# Patient Record
Sex: Male | Born: 1960
Health system: Southern US, Community
[De-identification: ages and names within clinical notes are randomized; demographics above are authoritative.]

## PROBLEM LIST (undated history)

## (undated) DIAGNOSIS — H269 Unspecified cataract: Secondary | ICD-10-CM

## (undated) DIAGNOSIS — T7840XA Allergy, unspecified, initial encounter: Secondary | ICD-10-CM

## (undated) HISTORY — DX: Allergy, unspecified, initial encounter: T78.40XA

## (undated) HISTORY — DX: Unspecified cataract: H26.9

## (undated) HISTORY — PX: EYE SURGERY: SHX253

---

## 2010-05-26 ENCOUNTER — Encounter: Admission: RE | Admit: 2010-05-26 | Discharge: 2010-05-26 | Payer: Self-pay | Admitting: Emergency Medicine

## 2012-04-09 ENCOUNTER — Encounter: Payer: Self-pay | Admitting: Emergency Medicine

## 2012-04-09 ENCOUNTER — Ambulatory Visit (INDEPENDENT_AMBULATORY_CARE_PROVIDER_SITE_OTHER): Payer: BC Managed Care – PPO | Admitting: Emergency Medicine

## 2012-04-09 ENCOUNTER — Ambulatory Visit: Payer: BC Managed Care – PPO

## 2012-04-09 VITALS — BP 102/60 | HR 80 | Temp 98.0°F | Resp 16 | Ht 71.0 in | Wt 161.0 lb

## 2012-04-09 DIAGNOSIS — M1990 Unspecified osteoarthritis, unspecified site: Secondary | ICD-10-CM

## 2012-04-09 DIAGNOSIS — M129 Arthropathy, unspecified: Secondary | ICD-10-CM

## 2012-04-09 LAB — POCT CBC
HCT, POC: 51.4 % (ref 43.5–53.7)
Hemoglobin: 16.5 g/dL (ref 14.1–18.1)
Lymph, poc: 1.9 (ref 0.6–3.4)
MCH, POC: 31.3 pg — AB (ref 27–31.2)
MCHC: 32.1 g/dL (ref 31.8–35.4)
MCV: 97.6 fL — AB (ref 80–97)
WBC: 6.8 10*3/uL (ref 4.6–10.2)

## 2012-04-09 LAB — POCT SEDIMENTATION RATE: POCT SED RATE: 3 mm/hr (ref 0–22)

## 2012-04-09 MED ORDER — MELOXICAM 7.5 MG PO TABS: ORAL_TABLET | ORAL | Status: DC

## 2012-04-09 NOTE — Progress Notes (Signed)
  Subjective:    Patient ID: Stephen Peters, male    DOB: April 23, 1961, 51 y.o.   MRN: [id number]  HPI patient here complaining of pain in multiple joints these included shoulders shoulder blades elbows hands wrists knees and feet. He states his joints do not become red or swollen. He has tried some Tylenol and ibuprofen but none of these seem to help. He denies any family history of arthritis.    Review of Systems     Objective:   Physical Exam physical exam does not reveal any signs of arthritic changes of his hands feet knees or scapula.  [facility] reading (PRIMARY) by  Dr.Naomia Lenderman multiple views of the hands wrists knee do not reveal any signs of arthritis or any significant abnormality.       Assessment & Plan:    He's not showing signs of inflammatory arthritis on x-ray. Labs were done. We'll try Mobic 7.5 and see if that gives him some help .

## 2012-04-10 LAB — COMPREHENSIVE METABOLIC PANEL
ALT: 15 U/L (ref 0–53)
AST: 21 U/L (ref 0–37)
Albumin: 4.7 g/dL (ref 3.5–5.2)
Alkaline Phosphatase: 56 U/L (ref 39–117)
BUN: 23 mg/dL (ref 6–23)
CO2: 28 mEq/L (ref 19–32)
Calcium: 9.4 mg/dL (ref 8.4–10.5)
Chloride: 102 mEq/L (ref 96–112)
Creat: 0.9 mg/dL (ref 0.50–1.35)
Glucose, Bld: 112 mg/dL — ABNORMAL HIGH (ref 70–99)
Potassium: 4.4 mEq/L (ref 3.5–5.3)
Sodium: 138 mEq/L (ref 135–145)
Total Bilirubin: 0.6 mg/dL (ref 0.3–1.2)
Total Protein: 7 g/dL (ref 6.0–8.3)

## 2012-04-10 LAB — HLA-B27 ANTIGEN: DNA Result:: NOT DETECTED

## 2012-04-10 LAB — URIC ACID: Uric Acid, Serum: 4.3 mg/dL (ref 4.0–7.8)

## 2012-04-10 LAB — RHEUMATOID FACTOR: Rhuematoid fact SerPl-aCnc: 11 IU/mL (ref <=14)

## 2012-04-10 LAB — ANA: Anti Nuclear Antibody(ANA): NEGATIVE

## 2012-08-29 ENCOUNTER — Ambulatory Visit (INDEPENDENT_AMBULATORY_CARE_PROVIDER_SITE_OTHER): Payer: BC Managed Care – PPO | Admitting: Internal Medicine

## 2012-08-29 VITALS — BP 137/83 | HR 100 | Temp 98.4°F | Resp 17 | Ht 70.5 in | Wt 157.0 lb

## 2012-08-29 DIAGNOSIS — T7840XA Allergy, unspecified, initial encounter: Secondary | ICD-10-CM

## 2012-08-29 DIAGNOSIS — R05 Cough: Secondary | ICD-10-CM

## 2012-08-29 DIAGNOSIS — J329 Chronic sinusitis, unspecified: Secondary | ICD-10-CM

## 2012-08-29 DIAGNOSIS — R059 Cough, unspecified: Secondary | ICD-10-CM

## 2012-08-29 MED ORDER — FLUTICASONE PROPIONATE 50 MCG/ACT NA SUSP: 2.0000 | Freq: Every day | NASAL | Status: DC

## 2012-08-29 MED ORDER — AZITHROMYCIN 500 MG PO TABS: 500.0000 mg | ORAL_TABLET | Freq: Every day | ORAL | Status: DC

## 2012-08-29 MED ORDER — HYDROCODONE-ACETAMINOPHEN 7.5-325 MG/15ML PO SOLN: 5.0000 mL | Freq: Four times a day (QID) | ORAL | Status: DC | PRN

## 2012-08-29 NOTE — Patient Instructions (Signed)
Allergic Rhinitis Allergic rhinitis is when the mucous membranes in the nose respond to allergens. Allergens are particles in the air that cause your body to have an allergic reaction. This causes you to release allergic antibodies. Through a chain of events, these eventually cause you to release histamine into the blood stream (hence the use of antihistamines). Although meant to be protective to the body, it is this release that causes your discomfort, such as frequent sneezing, congestion and an itchy runny nose.  CAUSES  The pollen allergens may come from grasses, trees, and weeds. This is seasonal allergic rhinitis, or "hay fever." Other allergens cause year-round allergic rhinitis (perennial allergic rhinitis) such as house dust mite allergen, pet dander and mold spores.  SYMPTOMS   Nasal stuffiness (congestion).  Runny, itchy nose with sneezing and tearing of the eyes.  There is often an itching of the mouth, eyes and ears. It cannot be cured, but it can be controlled with medications. DIAGNOSIS  If you are unable to determine the offending allergen, skin or blood testing may find it. TREATMENT   Avoid the allergen.  Medications and allergy shots (immunotherapy) can help.  Hay fever may often be treated with antihistamines in pill or nasal spray forms. Antihistamines block the effects of histamine. There are over-the-counter medicines that may help with nasal congestion and swelling around the eyes. Check with your caregiver before taking or giving this medicine. If the treatment above does not work, there are many new medications your caregiver can prescribe. Stronger medications may be used if initial measures are ineffective. Desensitizing injections can be used if medications and avoidance fails. Desensitization is when a patient is given ongoing shots until the body becomes less sensitive to the allergen. Make sure you follow up with your caregiver if problems continue. SEEK MEDICAL  CARE IF:   You develop fever (more than 100.5 F (38.1 C).  You develop a cough that does not stop easily (persistent).  You have shortness of breath.  You start wheezing.  Symptoms interfere with normal daily activities. Document Released: 04/24/2001 Document Revised: 10/22/2011 Document Reviewed: 11/03/2008 ExitCare Patient Information 2013 [employer]. Sinusitis Sinusitis is redness, soreness, and swelling (inflammation) of the paranasal sinuses. Paranasal sinuses are air pockets within the bones of your face (beneath the eyes, the middle of the forehead, or above the eyes). In healthy paranasal sinuses, mucus is able to drain out, and air is able to circulate through them by way of your nose. However, when your paranasal sinuses are inflamed, mucus and air can become trapped. This can allow bacteria and other germs to grow and cause infection. Sinusitis can develop quickly and last only a short time (acute) or continue over a long period (chronic). Sinusitis that lasts for more than 12 weeks is considered chronic.  CAUSES  Causes of sinusitis include:  Allergies.  Structural abnormalities, such as displacement of the cartilage that separates your nostrils (deviated septum), which can decrease the air flow through your nose and sinuses and affect sinus drainage.  Functional abnormalities, such as when the small hairs (cilia) that line your sinuses and help remove mucus do not work properly or are not present. SYMPTOMS  Symptoms of acute and chronic sinusitis are the same. The primary symptoms are pain and pressure around the affected sinuses. Other symptoms include:  Upper toothache.  Earache.  Headache.  Bad breath.  Decreased sense of smell and taste.  A cough, which worsens when you are lying flat.  Fatigue.    Fever.  Thick drainage from your nose, which often is green and may contain pus (purulent).  Swelling and warmth over the affected sinuses. DIAGNOSIS    Your caregiver will perform a physical exam. During the exam, your caregiver may:  Look in your nose for signs of abnormal growths in your nostrils (nasal polyps).  Tap over the affected sinus to check for signs of infection.  View the inside of your sinuses (endoscopy) with a special imaging device with a light attached (endoscope), which is inserted into your sinuses. If your caregiver suspects that you have chronic sinusitis, one or more of the following tests may be recommended:  Allergy tests.  Nasal culture A sample of mucus is taken from your nose and sent to a lab and screened for bacteria.  Nasal cytology A sample of mucus is taken from your nose and examined by your caregiver to determine if your sinusitis is related to an allergy. TREATMENT  Most cases of acute sinusitis are related to a viral infection and will resolve on their own within 10 days. Sometimes medicines are prescribed to help relieve symptoms (pain medicine, decongestants, nasal steroid sprays, or saline sprays).  However, for sinusitis related to a bacterial infection, your caregiver will prescribe antibiotic medicines. These are medicines that will help kill the bacteria causing the infection.  Rarely, sinusitis is caused by a fungal infection. In theses cases, your caregiver will prescribe antifungal medicine. For some cases of chronic sinusitis, surgery is needed. Generally, these are cases in which sinusitis recurs more than 3 times per year, despite other treatments. HOME CARE INSTRUCTIONS   Drink plenty of water. Water helps thin the mucus so your sinuses can drain more easily.  Use a humidifier.  Inhale steam 3 to 4 times a day (for example, sit in the bathroom with the shower running).  Apply a warm, moist washcloth to your face 3 to 4 times a day, or as directed by your caregiver.  Use saline nasal sprays to help moisten and clean your sinuses.  Take over-the-counter or prescription medicines for  pain, discomfort, or fever only as directed by your caregiver. SEEK IMMEDIATE MEDICAL CARE IF:  You have increasing pain or severe headaches.  You have nausea, vomiting, or drowsiness.  You have swelling around your face.  You have vision problems.  You have a stiff neck.  You have difficulty breathing. MAKE SURE YOU:   Understand these instructions.  Will watch your condition.  Will get help right away if you are not doing well or get worse. Document Released: 07/30/2005 Document Revised: 10/22/2011 Document Reviewed: 08/14/2011 ExitCare Patient Information 2013 [employer].  

## 2012-08-29 NOTE — Progress Notes (Signed)
  Subjective:    Patient ID: Stephen Peters, male    DOB: Jul 24, 1961, 52 y.o.   MRN: [id number]  HPI Has seasonal allergys every fall and winter. Not on any prevention. Now has green nasal disch and face pain. Also has minimal productive cough.   Review of Systems     Objective:   Physical Exam  Constitutional: He is oriented to person, place, and time. He appears well-developed and well-nourished. No distress.  HENT:  Right Ear: External ear normal.  Left Ear: External ear normal.  Nose: Mucosal edema, rhinorrhea and sinus tenderness present. Right sinus exhibits maxillary sinus tenderness and frontal sinus tenderness. Left sinus exhibits maxillary sinus tenderness and frontal sinus tenderness.  Mouth/Throat: Oropharynx is clear and moist.  Neck: Neck supple.  Cardiovascular: Normal rate, regular rhythm and normal heart sounds.   Pulmonary/Chest: Effort normal and breath sounds normal.  Neurological: He is alert and oriented to person, place, and time. He exhibits normal muscle tone. Coordination normal.  Psychiatric: He has a normal mood and affect.          Assessment & Plan:  Sinusitis/allergic rhinitis/cough

## 2012-09-03 ENCOUNTER — Telehealth: Payer: Self-pay

## 2012-09-03 MED ORDER — OSELTAMIVIR PHOSPHATE 75 MG PO CAPS: 75.0000 mg | ORAL_CAPSULE | Freq: Two times a day (BID) | ORAL | Status: DC

## 2012-09-03 NOTE — Telephone Encounter (Signed)
Patient was seen last week for sinus issues but he thinks it was actually the flu since his son Alanda Slim  DOB 01/05/98 was seen today for positive flu test. His other child was rx'd tamiflu and he is wondering if he can get an rx for tamiflu as well?

## 2012-09-03 NOTE — Telephone Encounter (Signed)
Sent to pharmacy 

## 2012-09-04 NOTE — Telephone Encounter (Signed)
Pt notified that rx was sent in 

## 2012-11-11 DIAGNOSIS — Z0271 Encounter for disability determination: Secondary | ICD-10-CM

## 2013-03-07 ENCOUNTER — Encounter (HOSPITAL_BASED_OUTPATIENT_CLINIC_OR_DEPARTMENT_OTHER): Payer: Self-pay | Admitting: Emergency Medicine

## 2013-03-07 ENCOUNTER — Emergency Department (HOSPITAL_BASED_OUTPATIENT_CLINIC_OR_DEPARTMENT_OTHER)
Admission: EM | Admit: 2013-03-07 | Discharge: 2013-03-07 | Disposition: A | Discharge status: Home / Self Care | Payer: BC Managed Care – PPO | Attending: Emergency Medicine | Admitting: Emergency Medicine

## 2013-03-07 DIAGNOSIS — R21 Rash and other nonspecific skin eruption: Secondary | ICD-10-CM | POA: Insufficient documentation

## 2013-03-07 DIAGNOSIS — T63461A Toxic effect of venom of wasps, accidental (unintentional), initial encounter: Secondary | ICD-10-CM | POA: Insufficient documentation

## 2013-03-07 DIAGNOSIS — F17200 Nicotine dependence, unspecified, uncomplicated: Secondary | ICD-10-CM | POA: Insufficient documentation

## 2013-03-07 DIAGNOSIS — Z8669 Personal history of other diseases of the nervous system and sense organs: Secondary | ICD-10-CM | POA: Insufficient documentation

## 2013-03-07 DIAGNOSIS — Y92009 Unspecified place in unspecified non-institutional (private) residence as the place of occurrence of the external cause: Secondary | ICD-10-CM | POA: Insufficient documentation

## 2013-03-07 DIAGNOSIS — Y9389 Activity, other specified: Secondary | ICD-10-CM | POA: Insufficient documentation

## 2013-03-07 DIAGNOSIS — T6391XA Toxic effect of contact with unspecified venomous animal, accidental (unintentional), initial encounter: Secondary | ICD-10-CM | POA: Insufficient documentation

## 2013-03-07 LAB — CBC WITH DIFFERENTIAL/PLATELET
Basophils Absolute: 0 10*3/uL (ref 0.0–0.1)
Basophils Relative: 0 % (ref 0–1)
Eosinophils Relative: 3 % (ref 0–5)
HCT: 41.1 % (ref 39.0–52.0)
MCHC: 35.5 g/dL (ref 30.0–36.0)
MCV: 90.5 fL (ref 78.0–100.0)
Monocytes Absolute: 0.4 10*3/uL (ref 0.1–1.0)
Neutro Abs: 7 10*3/uL (ref 1.7–7.7)
RDW: 12.5 % (ref 11.5–15.5)

## 2013-03-07 LAB — BASIC METABOLIC PANEL
Calcium: 9.2 mg/dL (ref 8.4–10.5)
Creatinine, Ser: 0.9 mg/dL (ref 0.50–1.35)
GFR calc Af Amer: >90 mL/min (ref >90)
GFR calc non Af Amer: >90 mL/min (ref >90)

## 2013-03-07 LAB — CK: Total CK: 289 U/L — ABNORMAL HIGH (ref 7–232)

## 2013-03-07 MED ORDER — METHYLPREDNISOLONE SODIUM SUCC 125 MG IJ SOLR
125.0000 mg | Freq: Once | INTRAMUSCULAR | Status: AC
  Administered 2013-03-07: 125 mg via INTRAVENOUS
  Filled 2013-03-07: qty 2

## 2013-03-07 MED ORDER — OXYCODONE-ACETAMINOPHEN 5-325 MG PO TABS: 2.0000 | ORAL_TABLET | ORAL | Status: DC | PRN

## 2013-03-07 MED ORDER — HYDROMORPHONE HCL PF 1 MG/ML IJ SOLN
1.0000 mg | Freq: Once | INTRAMUSCULAR | Status: AC
  Administered 2013-03-07: 1 mg via INTRAVENOUS
  Filled 2013-03-07: qty 1

## 2013-03-07 MED ORDER — SODIUM CHLORIDE 0.9 % IV BOLUS (SEPSIS)
1000.0000 mL | Freq: Once | INTRAVENOUS | Status: AC
  Administered 2013-03-07: 1000 mL via INTRAVENOUS

## 2013-03-07 MED ORDER — PREDNISONE 20 MG PO TABS: ORAL_TABLET | ORAL | Status: DC

## 2013-03-07 MED ORDER — KETOROLAC TROMETHAMINE 30 MG/ML IJ SOLN
30.0000 mg | Freq: Once | INTRAMUSCULAR | Status: AC
  Administered 2013-03-07: 30 mg via INTRAVENOUS
  Filled 2013-03-07: qty 1

## 2013-03-07 MED ORDER — FENTANYL CITRATE 0.05 MG/ML IJ SOLN
50.0000 ug | Freq: Once | INTRAMUSCULAR | Status: AC
  Administered 2013-03-07: 50 ug via INTRAVENOUS
  Filled 2013-03-07: qty 2

## 2013-03-07 NOTE — ED Notes (Signed)
Pt has been stung multiple times by yellow jackets this am.  No airway impairment.  Noted numerous sting areas.

## 2013-03-07 NOTE — ED Provider Notes (Signed)
  CSN: [id number]     Arrival date & time 03/07/13  1222 History     First MD Initiated Contact with Patient 03/07/13 1246     Chief Complaint  Patient presents with  . Insect Bite   (Consider location/radiation/quality/duration/timing/severity/associated sxs/prior Treatment) HPI Pt reports he was working in his yard earlier this morning and was stung numerous times (>20) by yellow jackets. Mostly on lower legs but also on arms and abdomen. His wife gave him 50mg  PO benadryl about 10:30am and 800mg  Motrin around 11:30am. He reports gradually worsening, now moderate to severe aching pain mostly in legs. Denies any throat swelling, difficulty breathing or loss of consciousness but reports he felt 'woozy' in the waiting room. No low BP.  Past Medical History  Diagnosis Date  . Allergy   . Cataract    Past Surgical History  Procedure Laterality Date  . Eye surgery     History reviewed. No pertinent family history. History  Substance Use Topics  . Smoking status: Current Every Day Smoker -- 1.00 packs/day for 30 years    Types: Cigarettes  . Smokeless tobacco: Not on file  . Alcohol Use: No    Review of Systems All other systems reviewed and are negative except as noted in HPI.    Allergies  Review of patient's allergies indicates no active allergies.  Home Medications   Current Outpatient Rx  Name  Route  Sig  Dispense  Refill  . diphenhydrAMINE (BENADRYL) 25 mg capsule   Oral   Take 25 mg by mouth every 6 (six) hours as needed for itching.         Marland Kitchen ibuprofen (ADVIL,MOTRIN) 200 MG tablet   Oral   Take 800 mg by mouth every 6 (six) hours as needed for pain.          BP 141/84  Pulse 94  Temp(Src) 97.7 F (36.5 C) (Oral)  Resp 22  Ht 5' 11.5" (1.816 m)  Wt 160 lb (72.576 kg)  BMI 22.01 kg/m2  SpO2 100% Physical Exam  Nursing note and vitals reviewed. Constitutional: He is oriented to person, place, and time. He appears well-developed and well-nourished.   HENT:  Head: Normocephalic and atraumatic.  Eyes: EOM are normal. Pupils are equal, round, and reactive to light.  Neck: Normal range of motion. Neck supple.  Cardiovascular: Normal rate, normal heart sounds and intact distal pulses.   Pulmonary/Chest: Effort normal and breath sounds normal.  Abdominal: Bowel sounds are normal. He exhibits no distension. There is no tenderness.  Musculoskeletal: Normal range of motion. He exhibits tenderness (tender to palpation of soft tissues, compartments soft). He exhibits no edema.  Neurological: He is alert and oriented to person, place, and time. He has normal strength. No cranial nerve deficit or sensory deficit.  Skin: Skin is warm and dry. Rash (multiple small erythematous areas corresponding to stings, no significant local reaction otherwise) noted.  Psychiatric: He has a normal mood and affect.    ED Course   Procedures (including critical care time)  Labs Reviewed - No data to display No results found. No diagnosis found.  MDM  No improvement in pain despite fentanyl and toradol. Will give dilaudid and check basic labs to rule out more serious reaction/rhabdo. Care signed out to Dr. Fredderick Phenix at [facility].   Nyiah Pianka B. Bernette Mayers, MD 03/07/13 1517

## 2013-03-07 NOTE — ED Notes (Signed)
Patient took two 25mg  benadryl around 10:30 and 800 mg ibuprofen around 11:30

## 2013-03-07 NOTE — ED Provider Notes (Signed)
Pt feeling much better.  Discharged with percocet, prednisone.  Will return if symptoms worsen.  Results for orders placed during the hospital encounter of 03/07/13  CBC WITH DIFFERENTIAL      Result Value Range   WBC 9.1  4.0 - 10.5 K/uL   RBC 4.54  4.22 - 5.81 MIL/uL   Hemoglobin 14.6  13.0 - 17.0 g/dL   HCT 40.9  81.1 - 91.4 %   MCV 90.5  78.0 - 100.0 fL   MCH 32.2  26.0 - 34.0 pg   MCHC 35.5  30.0 - 36.0 g/dL   RDW 78.2  95.6 - 21.3 %   Platelets 187  150 - 400 K/uL   Neutrophils Relative % 77  43 - 77 %   Neutro Abs 7.0  1.7 - 7.7 K/uL   Lymphocytes Relative 16  12 - 46 %   Lymphs Abs 1.4  0.7 - 4.0 K/uL   Monocytes Relative 5  3 - 12 %   Monocytes Absolute 0.4  0.1 - 1.0 K/uL   Eosinophils Relative 3  0 - 5 %   Eosinophils Absolute 0.3  0.0 - 0.7 K/uL   Basophils Relative 0  0 - 1 %   Basophils Absolute 0.0  0.0 - 0.1 K/uL  BASIC METABOLIC PANEL      Result Value Range   Sodium 137  135 - 145 mEq/L   Potassium 4.1  3.5 - 5.1 mEq/L   Chloride 103  96 - 112 mEq/L   CO2 26  19 - 32 mEq/L   Glucose, Bld 109 (*) 70 - 99 mg/dL   BUN 15  6 - 23 mg/dL   Creatinine, Ser 0.86  0.50 - 1.35 mg/dL   Calcium 9.2  8.4 - 57.8 mg/dL   GFR calc non Af Amer >90  >90 mL/min   GFR calc Af Amer >90  >90 mL/min  CK      Result Value Range   Total CK 289 (*) 7 - 232 U/L   No results found.    Rolan Bucco, MD 03/07/13 [phone]

## 2013-04-20 ENCOUNTER — Ambulatory Visit (INDEPENDENT_AMBULATORY_CARE_PROVIDER_SITE_OTHER): Payer: BC Managed Care – PPO | Admitting: Emergency Medicine

## 2013-04-20 ENCOUNTER — Telehealth: Payer: Self-pay

## 2013-04-20 VITALS — BP 120/62 | HR 67 | Temp 98.0°F | Resp 16 | Ht 71.5 in | Wt 170.0 lb

## 2013-04-20 DIAGNOSIS — K219 Gastro-esophageal reflux disease without esophagitis: Secondary | ICD-10-CM

## 2013-04-20 MED ORDER — DEXLANSOPRAZOLE 30 MG PO CPDR: 30.0000 mg | DELAYED_RELEASE_CAPSULE | Freq: Every day | ORAL | Status: DC

## 2013-04-20 NOTE — Telephone Encounter (Signed)
PT STATES THE PHARMACY HAVE BEEN TRYING TO GET IN TOUCH [place] REGARDING A PRIOR AUTHORIZATION FOR HER HUSBAND'S MEDICINE. PLEASE CALL [phone] WHEN DONE THEY REALLY NEED HIS MEDS

## 2013-04-20 NOTE — Progress Notes (Signed)
  Subjective:    Patient ID: Stephen Peters, male    DOB: 05/12/1961, 52 y.o.   MRN: [id number]  HPI Pt complains of acid reflux. He says it's been bothering him over the last couple years, intermittently. Now it is every day. Denies chest pain or SOB. He has been taking prilosec, omeprazole, tums. Last CPE a couple years ago. He states he has gotten choked in the middle of the night. He sometimes eats late at night.     Review of Systems     Objective:   Physical Exam HEENT exam is unremarkable. Neck is supple. Chest is clear to auscultation percussion. Heart regular no murmurs abdomen flat liver and spleen not enlarged.        Assessment & Plan:  Patient has recurrent reflux symptoms. He had one choking episode. Trial of dexilant one a day

## 2013-04-20 NOTE — Telephone Encounter (Signed)
To Barbara 

## 2013-04-20 NOTE — Patient Instructions (Addendum)
Gastroesophageal Reflux Disease, Adult  Gastroesophageal reflux disease (GERD) happens when acid from your stomach flows up into the esophagus. When acid comes in contact with the esophagus, the acid causes soreness (inflammation) in the esophagus. Over time, GERD may create small holes (ulcers) in the lining of the esophagus.  CAUSES   · Increased body weight. This puts pressure on the stomach, making acid rise from the stomach into the esophagus.  · Smoking. This increases acid production in the stomach.  · Drinking alcohol. This causes decreased pressure in the lower esophageal sphincter (valve or ring of muscle between the esophagus and stomach), allowing acid from the stomach into the esophagus.  · Late evening meals and a full stomach. This increases pressure and acid production in the stomach.  · A malformed lower esophageal sphincter.  Sometimes, no cause is found.  SYMPTOMS   · Burning pain in the lower part of the mid-chest behind the breastbone and in the mid-stomach area. This may occur twice a week or more often.  · Trouble swallowing.  · Sore throat.  · Dry cough.  · Asthma-like symptoms including chest tightness, shortness of breath, or wheezing.  DIAGNOSIS   Your caregiver may be able to diagnose GERD based on your symptoms. In some cases, X-rays and other tests may be done to check for complications or to check the condition of your stomach and esophagus.  TREATMENT   Your caregiver may recommend over-the-counter or prescription medicines to help decrease acid production. Ask your caregiver before starting or adding any new medicines.   HOME CARE INSTRUCTIONS   · Change the factors that you can control. Ask your caregiver for guidance concerning weight loss, quitting smoking, and alcohol consumption.  · Avoid foods and drinks that make your symptoms worse, such as:  · Caffeine or alcoholic drinks.  · Chocolate.  · Peppermint or mint flavorings.  · Garlic and onions.  · Spicy foods.  · Citrus fruits,  such as oranges, lemons, or limes.  · Tomato-based foods such as sauce, chili, salsa, and pizza.  · Fried and fatty foods.  · Avoid lying down for the 3 hours prior to your bedtime or prior to taking a nap.  · Eat small, frequent meals instead of large meals.  · Wear loose-fitting clothing. Do not wear anything tight around your waist that causes pressure on your stomach.  · Raise the head of your bed 6 to 8 inches with wood blocks to help you sleep. Extra pillows will not help.  · Only take over-the-counter or prescription medicines for pain, discomfort, or fever as directed by your caregiver.  · Do not take aspirin, ibuprofen, or other nonsteroidal anti-inflammatory drugs (NSAIDs).  SEEK IMMEDIATE MEDICAL CARE IF:   · You have pain in your arms, neck, jaw, teeth, or back.  · Your pain increases or changes in intensity or duration.  · You develop nausea, vomiting, or sweating (diaphoresis).  · You develop shortness of breath, or you faint.  · Your vomit is green, yellow, black, or looks like coffee grounds or blood.  · Your stool is red, bloody, or black.  These symptoms could be signs of other problems, such as heart disease, gastric bleeding, or esophageal bleeding.  MAKE SURE YOU:   · Understand these instructions.  · Will watch your condition.  · Will get help right away if you are not doing well or get worse.  Document Released: 05/09/2005 Document Revised: 10/22/2011 Document Reviewed: 02/16/2011  ExitCare® Patient   Information ©2014 [employer].

## 2013-04-21 ENCOUNTER — Telehealth: Payer: Self-pay

## 2013-04-21 NOTE — Telephone Encounter (Signed)
PT WAS SEEN BY DR DAUB YESTERDAY AND STATED HIS MEDICINE HADN'T BEEN CALLED IN, LOOKED IN THE SYSTEM AND TOLD PT WHERE IT WAS CALLED INTO AND HE BECAME SO IRATE, HOLLERING AND REALLY ANGRY, STATES HE ALSO CHOKED IN HIS SLEEP LAST NIGHT AND HE TOLD DR DAUB ABOUT IT ALSO, WAS ON HOLD UNTIL I COULD GET SOMEONE TO SPEAK WITH HIM, BUT THEN  NEVER WAS ABLE TO RETRIEVE HIS CALL. THE NUMBER IS [phone]

## 2013-04-21 NOTE — Telephone Encounter (Signed)
Dexlansoprole was sent to [facility] [address]. Patient indicates this medication requires prior authorization. Patient wants to know how long the prior authorization process will take, he wants you to let him know today, he was very polite to me.

## 2013-04-22 NOTE — Telephone Encounter (Signed)
See notes under 04/21/13 message.

## 2013-04-22 NOTE — Telephone Encounter (Signed)
I sent completed form to [facility] yesterday 04/21/13 and then spoke w/ pt's wife to let her know it was done. She stated that pt had p/up and paid OOP for Rx already because he wanted to get relief from his Sxs. I advised wife that I would fax PA form back in with a note stating this and asking ins to backdate PA to cover what pt has already paid OOP. Advised her that [employer] normally does not backdate their PAs and suggested that she also call customer service and ask them to backdate. Wife agreed.  Called pt today to make sure that he had the info I had discussed with his wife. Also advised him that I have checked through faxes we have received today and have not gotten a decision yet, but will contact pt when we do.

## 2013-04-23 NOTE — Telephone Encounter (Signed)
PA approved for Dexilant through 08/12/2038. Notified pt and faxed copy of approval to pt (per his req) and to pharmacy.

## 2013-07-15 ENCOUNTER — Other Ambulatory Visit: Payer: Self-pay | Admitting: Emergency Medicine

## 2014-01-24 ENCOUNTER — Other Ambulatory Visit: Payer: Self-pay | Admitting: Physician Assistant

## 2014-02-22 ENCOUNTER — Other Ambulatory Visit: Payer: Self-pay | Admitting: Physician Assistant

## 2014-03-25 ENCOUNTER — Other Ambulatory Visit: Payer: Self-pay | Admitting: Physician Assistant

## 2014-04-14 ENCOUNTER — Ambulatory Visit (INDEPENDENT_AMBULATORY_CARE_PROVIDER_SITE_OTHER): Payer: BC Managed Care – PPO | Admitting: Emergency Medicine

## 2014-04-14 VITALS — BP 122/74 | HR 101 | Temp 98.2°F | Resp 17 | Ht 69.5 in | Wt 171.0 lb

## 2014-04-14 DIAGNOSIS — IMO0001 Reserved for inherently not codable concepts without codable children: Secondary | ICD-10-CM

## 2014-04-14 DIAGNOSIS — M791 Myalgia, unspecified site: Secondary | ICD-10-CM

## 2014-04-14 DIAGNOSIS — Z9109 Other allergy status, other than to drugs and biological substances: Secondary | ICD-10-CM

## 2014-04-14 DIAGNOSIS — R5381 Other malaise: Secondary | ICD-10-CM

## 2014-04-14 DIAGNOSIS — R5383 Other fatigue: Principal | ICD-10-CM

## 2014-04-14 DIAGNOSIS — K219 Gastro-esophageal reflux disease without esophagitis: Secondary | ICD-10-CM

## 2014-04-14 LAB — CBC
HCT: 45.8 % (ref 39.0–52.0)
Hemoglobin: 16 g/dL (ref 13.0–17.0)
MCH: 31.3 pg (ref 26.0–34.0)
MCHC: 34.9 g/dL (ref 30.0–36.0)
MCV: 89.5 fL (ref 78.0–100.0)
Platelets: 237 10*3/uL (ref 150–400)
RBC: 5.12 MIL/uL (ref 4.22–5.81)
RDW: 13.8 % (ref 11.5–15.5)
WBC: 10.2 10*3/uL (ref 4.0–10.5)

## 2014-04-14 LAB — CK: Total CK: 173 U/L (ref 7–232)

## 2014-04-14 MED ORDER — ALPRAZOLAM 0.25 MG PO TABS: ORAL_TABLET | ORAL | Status: DC

## 2014-04-14 MED ORDER — DEXLANSOPRAZOLE 30 MG PO CPDR: DELAYED_RELEASE_CAPSULE | ORAL | Status: DC

## 2014-04-14 MED ORDER — AZITHROMYCIN 250 MG PO TABS: ORAL_TABLET | ORAL | Status: DC

## 2014-04-14 NOTE — Progress Notes (Deleted)
   Subjective:    Patient ID: Stephen Peters, male    DOB: 12/30/1960, 53 y.o.   MRN: [id number]  HPI  Pt here for refill of Dexilant. He reports it is working 80% of the time  Pt reports he is sick again. Feels achy, fluish. Denies fever. Cough, sore throat, head congestion he admits to. He has been doing a lot of physical labor. All of the phlegm he coughs up is clear.   Pt also states he is not sleeping. He has a lot of stress. Reports he wakes up 20 times a night. His mother passed this year, he was up 20 times a night with her, now feels like he cannot get out of that routine. He has taken his wife's xanax a couple of times.     Review of Systems     Objective:   Physical Exam patient is alert and cooperative and in no distress. TMs are clear nose is congested throat normal chest exam revealed symmetrical breath sounds no dullness        Assessment & Plan:

## 2014-04-14 NOTE — Progress Notes (Addendum)
   Subjective:    Patient ID: Stephen Peters, male    DOB: [birth year], 53 y.o.   MRN: [id number]  HPI HPI Pt here for refill of Dexilant. He reports it is working 80% of the time   Pt reports he is sick again. Feels achy, fluish. Denies fever. Cough, sore throat, head congestion he admits to. He has been doing a lot of physical labor. All of the phlegm he coughs up is clear.   Pt also states he is not sleeping. He has a lot of stress. Reports he wakes up 20 times a night. His mother passed this year, he was up 20 times a night with her, now feels like he cannot get out of that routine. He has taken his wife's xanax a couple of times.      Review of Systems Patient denies any episodes of chest pain. He states he has been working out in the yard and not sleeping well and his muscles in his arms and legs feel weak and tired. He denies shortness of breath    Objective:   Physical Exam  Constitutional: He appears well-developed and well-nourished.  HENT:  There is significant nasal congestion with mild redness posterior pharnyx  Eyes: Conjunctivae and EOM are normal. Pupils are equal, round, and reactive to light.  Neck: Normal range of motion. No thyromegaly present.  Cardiovascular: Normal rate, regular rhythm and normal heart sounds.  Exam reveals no gallop and no friction rub.   No murmur heard. Pulmonary/Chest: Effort normal. No respiratory distress. He has no wheezes. He has no rales. He exhibits no tenderness.   Results for orders placed in visit on 04/14/14  CBC      Result Value Ref Range   WBC 10.2  4.0 - 10.5 K/uL   RBC 5.12  4.22 - 5.81 MIL/uL   Hemoglobin 16.0  13.0 - 17.0 g/dL   HCT 45.8  39.0 - 52.0 %   MCV 89.5  78.0 - 100.0 fL   MCH 31.3  26.0 - 34.0 pg   MCHC 34.9  30.0 - 36.0 g/dL   RDW 13.8  11.5 - 15.5 %   Platelets 237  150 - 400 K/uL  CK      Result Value Ref Range   Total CK 173  7 - 232 U/L          Assessment & Plan:  I refilled his Paxil and. I  placed him on Zithromax for antibiotic coverage as he has had problems with recurrent sinusitis. I advised him to take Claritin 1 a day along with Flonase nasal spray. His Paxil and was refilled. He stopped smoking about a year and a half ago. I plan to him he needed to have a full physical exam and advised him he had not had a chest x-ray since 2010. Patient could not stay and had to get back in the office but was agreeable to come in for a complete physical

## 2014-04-14 NOTE — Patient Instructions (Signed)

## 2014-04-14 NOTE — Progress Notes (Signed)
Physical scheduled on 9/22 at 945 with Dr. Everlene Farrier.

## 2014-05-04 ENCOUNTER — Telehealth: Payer: Self-pay | Admitting: Family Medicine

## 2014-05-04 ENCOUNTER — Encounter: Payer: Self-pay | Admitting: Emergency Medicine

## 2014-05-04 ENCOUNTER — Ambulatory Visit (INDEPENDENT_AMBULATORY_CARE_PROVIDER_SITE_OTHER): Payer: BC Managed Care – PPO | Admitting: Emergency Medicine

## 2014-05-04 ENCOUNTER — Other Ambulatory Visit: Payer: Self-pay | Admitting: Emergency Medicine

## 2014-05-04 ENCOUNTER — Ambulatory Visit (INDEPENDENT_AMBULATORY_CARE_PROVIDER_SITE_OTHER): Payer: BC Managed Care – PPO

## 2014-05-04 VITALS — BP 120/76 | HR 78 | Temp 98.1°F | Resp 16 | Ht 70.0 in | Wt 167.8 lb

## 2014-05-04 DIAGNOSIS — H029 Unspecified disorder of eyelid: Secondary | ICD-10-CM

## 2014-05-04 DIAGNOSIS — R9431 Abnormal electrocardiogram [ECG] [EKG]: Secondary | ICD-10-CM

## 2014-05-04 DIAGNOSIS — Z125 Encounter for screening for malignant neoplasm of prostate: Secondary | ICD-10-CM

## 2014-05-04 DIAGNOSIS — R5381 Other malaise: Secondary | ICD-10-CM

## 2014-05-04 DIAGNOSIS — Z13220 Encounter for screening for lipoid disorders: Secondary | ICD-10-CM

## 2014-05-04 DIAGNOSIS — F17200 Nicotine dependence, unspecified, uncomplicated: Secondary | ICD-10-CM

## 2014-05-04 DIAGNOSIS — R5383 Other fatigue: Principal | ICD-10-CM

## 2014-05-04 DIAGNOSIS — Z1329 Encounter for screening for other suspected endocrine disorder: Secondary | ICD-10-CM

## 2014-05-04 DIAGNOSIS — R9389 Abnormal findings on diagnostic imaging of other specified body structures: Secondary | ICD-10-CM

## 2014-05-04 LAB — COMPLETE METABOLIC PANEL WITH GFR
ALBUMIN: 4.8 g/dL (ref 3.5–5.2)
ALT: 26 U/L (ref 0–53)
AST: 28 U/L (ref 0–37)
Alkaline Phosphatase: 63 U/L (ref 39–117)
BILIRUBIN TOTAL: 0.8 mg/dL (ref 0.2–1.2)
BUN: 16 mg/dL (ref 6–23)
CHLORIDE: 101 meq/L (ref 96–112)
CO2: 30 mEq/L (ref 19–32)
Calcium: 9.8 mg/dL (ref 8.4–10.5)
Creat: 0.94 mg/dL (ref 0.50–1.35)
GFR, Est Non African American: >89 mL/min
Glucose, Bld: 100 mg/dL — ABNORMAL HIGH (ref 70–99)
Potassium: 4.3 mEq/L (ref 3.5–5.3)
SODIUM: 139 meq/L (ref 135–145)
Total Protein: 8 g/dL (ref 6.0–8.3)

## 2014-05-04 LAB — POCT URINALYSIS DIPSTICK
Bilirubin, UA: NEGATIVE
Blood, UA: NEGATIVE
Glucose, UA: NEGATIVE
Ketones, UA: NEGATIVE
LEUKOCYTES UA: NEGATIVE
Nitrite, UA: NEGATIVE
PH UA: 7
PROTEIN UA: NEGATIVE
SPEC GRAV UA: 1.015
Urobilinogen, UA: 1

## 2014-05-04 LAB — CBC WITH DIFFERENTIAL/PLATELET

## 2014-05-04 LAB — TSH: TSH: 0.822 u[IU]/mL (ref 0.350–4.500)

## 2014-05-04 LAB — LIPID PANEL
Cholesterol: 242 mg/dL — ABNORMAL HIGH (ref 0–200)
HDL: 57 mg/dL (ref >39)
LDL CALC: 164 mg/dL — AB (ref 0–99)
Total CHOL/HDL Ratio: 4.2 Ratio
Triglycerides: 103 mg/dL (ref <150)
VLDL: 21 mg/dL (ref 0–40)

## 2014-05-04 LAB — IFOBT (OCCULT BLOOD): IFOBT: NEGATIVE

## 2014-05-04 LAB — T4, FREE: FREE T4: 1.12 ng/dL (ref 0.80–1.80)

## 2014-05-04 MED ORDER — ALPRAZOLAM 0.25 MG PO TABS: ORAL_TABLET | ORAL | Status: DC

## 2014-05-04 MED ORDER — DEXLANSOPRAZOLE 30 MG PO CPDR: DELAYED_RELEASE_CAPSULE | ORAL | Status: DC

## 2014-05-04 NOTE — Progress Notes (Addendum)
Subjective:    Patient ID: Stephen Peters, male    DOB: 05-01-1961, 53 y.o.   MRN: [id number] This chart was scribed for Darlyne Russian, MD by Zola Button, ED Scribe. This patient was seen in room Room/bed 21 and the patient's care was started at 10:17 AM.   HPI HPI Comments: Stephen Peters is a 53 y.o. male who presents to the Emergency Department for an annual exam. Last time patient has had a physical was 4-5 years ago. Patient came in last time for allergies that was treated with Z-pack and an intermittent lingering cough, which he still has. He has taken prednisone in the past for a bee sting. Patient has quit smoking 18 months ago and has an average of 10 drinks a week. He states that his job is still stressful, but he is taking more time off. He currently has an 33 year old son. He takes Dexilant for GERD and 0.25 Alprozolam for sleep, with improved sleeping and less snoring. His parents are currently deceased - his father died 51 years ago due to CHF and bladder CA and his mother died of Alzheimer's 2 months ago. Patient says that he is coping very well. He has been re-married (3rd marriage) for almost 5 years.   Review of Systems  Constitutional: Negative for fever.  Respiratory: Positive for cough.        Objective:   Physical Exam CONSTITUTIONAL: Well developed/well nourished HEAD: Normocephalic/atraumatic EYES: EOM/PERRL, Left upper eyelid - there is a 1 x 1.5cm flat growth involving the mid portion of the upper lid ENMT: Mucous membranes moist NECK: supple no meningeal signs SPINE: entire spine nontender CV: S1/S2 noted, no murmurs/rubs/gallops noted LUNGS: Lungs are clear to auscultation bilaterally, no apparent distress ABDOMEN: soft, nontender, no rebound or guarding GU: no cva tenderness, firm area just below left testicle consistent with a spermatacele NEURO: Pt is awake/alert, moves all extremitiesx4 EXTREMITIES: pulses normal, full ROM SKIN: warm, color normal PSYCH: no  abnormalities of mood noted RECTAL: prostate small and no nodules EKG T. down in aVL and leads V5 and V6 PFT nomal [facility] reading (PRIMARY) by  Dr. Everlene Farrier please comment on right apex no definite abnormality Results for orders placed in visit on 05/04/14  IFOBT (OCCULT BLOOD)      Result Value Ref Range   IFOBT Negative    POCT URINALYSIS DIPSTICK      Result Value Ref Range   Color, UA yellow     Clarity, UA clear     Glucose, UA neg     Bilirubin, UA neg     Ketones, UA neg     Spec Grav, UA 1.015     Blood, UA neg     pH, UA 7.0     Protein, UA neg     Urobilinogen, UA 1.0     Nitrite, UA neg     Leukocytes, UA Negative         Assessment & Plan:   Face limb was refilled for sleep at night. Appointment made to see the ophthalmologist regarding a lesion of his left upper lid. Appointment also made with cardiology to review his EKG and consideration reguarding cardiac screening I personally performed the services described in this documentation, which was scribed in my presence. The recorded information has been reviewed and is accurate we'll hold off on medication for his cough at the present time. This certainly could be reflux related. Patient advised of the abnormal area seen on  chest x-ray. Proceed with CT of the chest

## 2014-05-04 NOTE — Telephone Encounter (Signed)
Spoke with patient he will come into office on 05/05/14 to get a CBC POC done no charge

## 2014-05-05 ENCOUNTER — Other Ambulatory Visit: Payer: BC Managed Care – PPO | Admitting: Family Medicine

## 2014-05-05 ENCOUNTER — Telehealth: Payer: Self-pay | Admitting: Radiology

## 2014-05-05 ENCOUNTER — Other Ambulatory Visit: Payer: Self-pay | Admitting: Radiology

## 2014-05-05 ENCOUNTER — Ambulatory Visit
Admission: RE | Admit: 2014-05-05 | Discharge: 2014-05-05 | Disposition: A | Discharge status: Home / Self Care | Payer: BC Managed Care – PPO | Source: Ambulatory Visit | Attending: Emergency Medicine | Admitting: Emergency Medicine

## 2014-05-05 DIAGNOSIS — F17200 Nicotine dependence, unspecified, uncomplicated: Secondary | ICD-10-CM

## 2014-05-05 DIAGNOSIS — Z13220 Encounter for screening for lipoid disorders: Secondary | ICD-10-CM

## 2014-05-05 DIAGNOSIS — R9389 Abnormal findings on diagnostic imaging of other specified body structures: Secondary | ICD-10-CM

## 2014-05-05 DIAGNOSIS — R5383 Other fatigue: Secondary | ICD-10-CM

## 2014-05-05 DIAGNOSIS — Z125 Encounter for screening for malignant neoplasm of prostate: Secondary | ICD-10-CM

## 2014-05-05 DIAGNOSIS — Z1329 Encounter for screening for other suspected endocrine disorder: Secondary | ICD-10-CM

## 2014-05-05 DIAGNOSIS — R5381 Other malaise: Secondary | ICD-10-CM

## 2014-05-05 LAB — PSA: PSA: 2.32 ng/mL (ref <=4.00)

## 2014-05-05 MED ORDER — IOHEXOL 300 MG/ML  SOLN
75.0000 mL | Freq: Once | INTRAMUSCULAR | Status: AC | PRN
  Administered 2014-05-05: 75 mL via INTRAVENOUS

## 2014-05-05 MED ORDER — ATORVASTATIN CALCIUM 40 MG PO TABS: 40.0000 mg | ORAL_TABLET | Freq: Every day | ORAL | Status: DC

## 2014-05-05 NOTE — Telephone Encounter (Signed)
Pt is anxious about his CT results. Can you call him on his cell with these results?

## 2014-05-06 NOTE — Telephone Encounter (Signed)
I called and spoke with patient. I answered his questions.

## 2014-06-08 ENCOUNTER — Ambulatory Visit (INDEPENDENT_AMBULATORY_CARE_PROVIDER_SITE_OTHER): Payer: BC Managed Care – PPO | Admitting: Cardiology

## 2014-06-08 ENCOUNTER — Encounter: Payer: Self-pay | Admitting: Cardiology

## 2014-06-08 VITALS — BP 114/78 | HR 70 | Ht 71.0 in | Wt 170.0 lb

## 2014-06-08 DIAGNOSIS — E7800 Pure hypercholesterolemia, unspecified: Secondary | ICD-10-CM | POA: Insufficient documentation

## 2014-06-08 DIAGNOSIS — R9431 Abnormal electrocardiogram [ECG] [EKG]: Secondary | ICD-10-CM

## 2014-06-08 NOTE — Patient Instructions (Signed)
Your physician recommends that you continue on your current medications as directed. Please refer to the Current Medication list given to you today.  Your physician has requested that you have an exercise tolerance test. For further information please visit [email or web address]. Please also follow instruction sheet, as given.  FOLLOW UP AS NEEDED

## 2014-06-08 NOTE — Progress Notes (Signed)
Stephen Peters Date of Birth:  22-Feb-1961 [place] [address] [place], [place]  [address] [phone]        Fax   [phone]   History of Present Illness: This pleasant 53 year old gentleman is seen at the request of Dr. Nena Jordan for cardiovascular evaluation.  The patient had a recent annual physical with Dr.Daub.  The patient does not have any history of known heart disease.  His electrocardiogram at the time of his annual physical showed some very subtle T-wave flattening but no acute changes.  The patient was noted to have significant hyperlipidemia with an LDL of 164 and he was started on Lipitor 40 mg daily.  He is a former smoker.  He quit smoking 2 years ago.  He had a recent chest x-ray which suggested some possible abnormalities but a subsequent followup CT scan of the chest showed no evidence of any malignancy or other concern.  His heart size is normal by chest xray.  The patient has not been experiencing any chest pain.  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  He has not been getting much regular interventional exercise recently. He thinks that his last stress test was about 15 years ago. His family history reveals that his father died at age 3 of strokes and cancer.  His mother died at 58 of Alzheimer's. Past medical history includes the fact that the patient has GERD and takes Dexilant on a selective basis before certain meals.  Current Outpatient Prescriptions  Medication Sig Dispense Refill  . ALPRAZolam (XANAX) 0.25 MG tablet Take one tablet at bedtime  30 tablet  5  . atorvastatin (LIPITOR) 40 MG tablet Take 1 tablet (40 mg total) by mouth daily.  30 tablet  5  . Dexlansoprazole (DEXILANT) 30 MG capsule Take 1 tablet daily  30 capsule  11   No current facility-administered medications for this visit.    No Known Allergies  Patient Active Problem List   Diagnosis Date Noted  . Hypercholesteremia 06/08/2014  . Nonspecific  abnormal electrocardiogram (ECG) (EKG) 06/08/2014  . Gastro-esophageal reflux 04/14/2014  . Environmental allergies 04/14/2014    History  Smoking status  . Former Smoker -- 1.00 packs/day for 30 years  . Types: Cigarettes  . Quit date: 10/12/2012  Smokeless tobacco  . Not on file    History  Alcohol Use  . Yes    Comment: 10 drinks    No family history on file.  Review of Systems: Constitutional: no fever chills diaphoresis or fatigue or change in weight.  Head and neck: no hearing loss, no epistaxis, no photophobia or visual disturbance. Respiratory: No cough, shortness of breath or wheezing. Cardiovascular: No chest pain peripheral edema, palpitations. Gastrointestinal: No abdominal distention, no abdominal pain, no change in bowel habits hematochezia or melena. Genitourinary: No dysuria, no frequency, no urgency, no nocturia. Musculoskeletal:No arthralgias, no back pain, no gait disturbance or myalgias. Neurological: No dizziness, no headaches, no numbness, no seizures, no syncope, no weakness, no tremors. Hematologic: No lymphadenopathy, no easy bruising. Psychiatric: No confusion, no hallucinations, no sleep disturbance.    Physical Exam: Filed Vitals:   06/08/14 1539  BP: 114/78  Pulse: 70  The patient appears to be in no distress.  Head and neck exam reveals that the pupils are equal and reactive.  The extraocular movements are full.  There is no scleral icterus.  Mouth and pharynx are benign.  No lymphadenopathy.  No carotid bruits.  The jugular venous pressure is normal.  Thyroid is not enlarged or tender.  Chest is clear to percussion and auscultation.  No rales or rhonchi.  Expansion of the chest is symmetrical.  Heart reveals no abnormal lift or heave.  First and second heart sounds are normal.  There is no murmur gallop rub or click.  The abdomen is soft and nontender.  Bowel sounds are normoactive.  There is no hepatosplenomegaly or mass.  There are no  abdominal bruits.  Extremities reveal no phlebitis or edema.  Pedal pulses are good.  There is no cyanosis or clubbing.  Neurologic exam is normal strength and no lateralizing weakness.  No sensory deficits.  Integument reveals no rash  EKG today shows normal sinus rhythm and mild T wave flattening in V5, improved since 05/04/14  Assessment / Plan: 1. Hypercholesterolemia 2. mildly abnormal resting EKG with nonspecific T-wave changes. 3. history of GERD  Disposition: We will have the patient return for a Bruce protocol treadmill stress. Continue current medication Many thanks for the opportunity to see this pleasant gentleman with you.  We will be in touch regarding the results of the treadmill stress test.  Recheck here when necessary

## 2014-06-10 NOTE — Addendum Note (Signed)
Addended by: Alvina Filbert B on: 06/10/2014 11:47 AM   Modules accepted: Orders

## 2014-07-13 ENCOUNTER — Encounter: Payer: Self-pay | Admitting: *Deleted

## 2014-07-20 ENCOUNTER — Ambulatory Visit (INDEPENDENT_AMBULATORY_CARE_PROVIDER_SITE_OTHER): Payer: BC Managed Care – PPO | Admitting: Physician Assistant

## 2014-07-20 DIAGNOSIS — R9431 Abnormal electrocardiogram [ECG] [EKG]: Secondary | ICD-10-CM

## 2014-07-20 DIAGNOSIS — E7800 Pure hypercholesterolemia, unspecified: Secondary | ICD-10-CM

## 2014-07-20 NOTE — Progress Notes (Signed)
Exercise Treadmill Test  Pre-Exercise Testing Evaluation Rhythm: sinus tachycardia  Rate: 91 bpm     Test  Exercise Tolerance Test Ordering MD: Darlin Coco, MD  Interpreting MD: Richardson Dopp, PA-C  Unique Test No: 1  Treadmill:  1  Indication for ETT: abnormal EKG  Contraindication to ETT: No   Stress Modality: exercise - treadmill  Cardiac Imaging Performed: non   Protocol: standard Bruce - maximal  Max BP:  195/79  Max MPHR (bpm):  166 85% MPR (bpm):  141  MPHR obtained (bpm):  151 % MPHR obtained:  91  Reached 85% MPHR (min:sec):  6:12 Total Exercise Time (min-sec):  8:00  Workload in METS:  10.0 Borg Scale: 15  Reason ETT Terminated:  desired heart rate attained    ST Segment Analysis At Rest: non-specific ST segment slurring With Exercise: non-specific ST changes  Other Information Arrhythmia:  No Angina during ETT:  absent (0) Quality of ETT:  diagnostic  ETT Interpretation:  normal - no evidence of ischemia by ST analysis  Comments: Good exercise capacity. No chest pain. Normal BP response to exercise. Non-specific ST changes.  No ST changes to suggest ischemia.  Recommendations: FU with Dr. Darlin Coco as planned. Signed,  Richardson Dopp, PA-C   07/20/2014 6:10 PM

## 2014-11-20 ENCOUNTER — Other Ambulatory Visit: Payer: Self-pay | Admitting: Emergency Medicine

## 2014-12-01 ENCOUNTER — Other Ambulatory Visit: Payer: Self-pay | Admitting: Emergency Medicine

## 2014-12-02 NOTE — Telephone Encounter (Signed)
Called in.

## 2014-12-20 ENCOUNTER — Other Ambulatory Visit: Payer: Self-pay | Admitting: Physician Assistant

## 2015-01-02 ENCOUNTER — Other Ambulatory Visit: Payer: Self-pay | Admitting: Emergency Medicine

## 2015-02-08 ENCOUNTER — Other Ambulatory Visit: Payer: Self-pay | Admitting: Emergency Medicine

## 2015-02-15 ENCOUNTER — Other Ambulatory Visit: Payer: Self-pay | Admitting: Emergency Medicine

## 2015-02-16 NOTE — Telephone Encounter (Signed)
I called pt to make sure he had been getting our messages on last several RFs that he needs OV, and to see when pt could make plans to come in. He stated that he had just been in for a CPE not long ago, and when I advised it had been last Sept and our providers normally like to see pt's every 6 mos,

## 2015-02-16 NOTE — Telephone Encounter (Signed)
Continue previous message entry:  Patient stated that he is tired of paying the insurance company so much. He advised that the last time he was in he was sent to about 6 specialists and he had to pay all of them and they found nothing wrong with him. He stated that he feels good and that if we don't feel that he needs his medication badly enough to Rx it for him than he will just stop taking it. A couple of times I advised pt that we will be happy to manage his medications, but providers need to check him regularly to make sure meds are working and that labs are normal. Pt stated again that if we think don't think he needs the meds badly enough to Rx them that he will just not take them. He wanted me to send this message to Dr Everlene Farrier to let him know what he said, stating that Dr Everlene Farrier is a friend of his.

## 2015-04-05 ENCOUNTER — Other Ambulatory Visit: Payer: Self-pay

## 2015-04-05 MED ORDER — ALPRAZOLAM 0.25 MG PO TABS: 0.2500 mg | ORAL_TABLET | Freq: Every day | ORAL | Status: DC

## 2015-04-05 NOTE — Telephone Encounter (Signed)
Dr Everlene Farrier, it has been almost a year since pt was seen, so I pended w/note that he needs OV for more. Please review.

## 2015-05-06 ENCOUNTER — Other Ambulatory Visit: Payer: Self-pay | Admitting: Emergency Medicine

## 2015-05-09 NOTE — Telephone Encounter (Signed)
Rx faxed

## 2015-05-11 ENCOUNTER — Other Ambulatory Visit: Payer: Self-pay | Admitting: Emergency Medicine

## 2015-05-17 ENCOUNTER — Encounter: Payer: Self-pay | Admitting: Emergency Medicine

## 2015-05-26 ENCOUNTER — Other Ambulatory Visit: Payer: Self-pay | Admitting: Emergency Medicine

## 2015-05-27 ENCOUNTER — Other Ambulatory Visit: Payer: Self-pay | Admitting: *Deleted

## 2015-05-27 MED ORDER — DEXLANSOPRAZOLE 30 MG PO CPDR: DELAYED_RELEASE_CAPSULE | ORAL | Status: DC

## 2015-06-10 ENCOUNTER — Other Ambulatory Visit: Payer: Self-pay | Admitting: Emergency Medicine

## 2015-06-10 NOTE — Telephone Encounter (Signed)
Approved for 30 days in Dr. Perfecto Kingdom absence.

## 2015-06-13 NOTE — Telephone Encounter (Signed)
Faxed

## 2015-06-24 DIAGNOSIS — Z0271 Encounter for disability determination: Secondary | ICD-10-CM

## 2015-06-25 ENCOUNTER — Other Ambulatory Visit: Payer: Self-pay | Admitting: Emergency Medicine

## 2015-07-11 ENCOUNTER — Other Ambulatory Visit: Payer: Self-pay | Admitting: Emergency Medicine

## 2015-07-12 ENCOUNTER — Other Ambulatory Visit: Payer: Self-pay | Admitting: Urgent Care

## 2015-07-12 NOTE — Telephone Encounter (Signed)
Spoke with patient 15 capsules will be sent in he needs follow up.  Patient underdstood

## 2015-07-14 ENCOUNTER — Other Ambulatory Visit: Payer: Self-pay | Admitting: Urgent Care

## 2015-08-16 ENCOUNTER — Other Ambulatory Visit: Payer: Self-pay | Admitting: Emergency Medicine

## 2015-08-17 NOTE — Telephone Encounter (Signed)
Faxed

## 2015-09-16 ENCOUNTER — Other Ambulatory Visit: Payer: Self-pay | Admitting: Emergency Medicine

## 2015-09-17 ENCOUNTER — Telehealth: Payer: Self-pay | Admitting: *Deleted

## 2015-09-17 NOTE — Telephone Encounter (Signed)
Called in Xanax .25mg  tp [facility]. Patient aware

## 2015-10-18 ENCOUNTER — Ambulatory Visit (INDEPENDENT_AMBULATORY_CARE_PROVIDER_SITE_OTHER): Payer: BLUE CROSS/BLUE SHIELD | Admitting: Emergency Medicine

## 2015-10-18 VITALS — BP 144/80 | HR 78 | Temp 98.1°F | Resp 16 | Ht 70.0 in | Wt 170.0 lb

## 2015-10-18 DIAGNOSIS — K219 Gastro-esophageal reflux disease without esophagitis: Secondary | ICD-10-CM

## 2015-10-18 DIAGNOSIS — G4700 Insomnia, unspecified: Secondary | ICD-10-CM

## 2015-10-18 DIAGNOSIS — F419 Anxiety disorder, unspecified: Secondary | ICD-10-CM | POA: Insufficient documentation

## 2015-10-18 MED ORDER — ATORVASTATIN CALCIUM 40 MG PO TABS: ORAL_TABLET | ORAL | Status: DC

## 2015-10-18 MED ORDER — DEXLANSOPRAZOLE 30 MG PO CPDR: DELAYED_RELEASE_CAPSULE | ORAL | Status: DC

## 2015-10-18 MED ORDER — ALPRAZOLAM 0.5 MG PO TABS: ORAL_TABLET | ORAL | Status: DC

## 2015-10-18 NOTE — Assessment & Plan Note (Addendum)
Appears highly associated with work stress. He is now a little more stressed because he is losing part of his company. Xanax has worked in the past for him. Will add Xanax 0.25mg  in the day to his regimen. If appears he may need chronic management of this, will consider switching to an SSRI. Follow-up in one month.

## 2015-10-18 NOTE — Assessment & Plan Note (Signed)
Will refill atorvastatin 40mg  daily

## 2015-10-18 NOTE — Progress Notes (Signed)
    Subjective    Stephen Peters is a 55 y.o. male that presents for a follow-up visit for:    1. GERD: He currently takes Dexilant 30mg  for his acid reflux. He states that it works great. Without the medication, only has symptoms at night. He previously used to watch what he ate, but would prefer to eat what he wants and use the medication to help with symptoms.  2. Insomnia: He has racing thoughts at night from the stress of the day. He has been taking alprazolam 0.25mg  at night which helped him sleep at night. More recently, however, he has noticed that he wakes up during the night.   3. Anxiety: Stephen Peters states that he is currently going through a divorce. This combined with his stressful work responsibilities has caused him to be very anxious throughout the day. He reports his Xanax has helped his nerves when he takes it and is curious if he can start taking a dose in the day time to help him cope with his symptoms. He has had no side effects from the Xanax.  4. Hypercholesterolemia: Chronic issue. Patient has not been on atorvastatin because he could not get a refill. His last LDL was 164 in 2015.  Social History  Substance Use Topics  . Smoking status: Former Smoker -- 1.00 packs/day for 30 years    Types: Cigarettes    Quit date: 10/12/2012  . Smokeless tobacco: Never Used  . Alcohol Use: 0.0 oz/week    0 Standard drinks or equivalent per week     Comment: 10 drinks    No Known Allergies  No orders of the defined types were placed in this encounter.    ROS  Per HPI   Objective   BP 144/80 mmHg  Pulse 78  Temp(Src) 98.1 F (36.7 C) (Oral)  Resp 16  Ht 5\' 10"  (1.778 m)  Wt 170 lb (77.111 kg)  BMI 24.39 kg/m2  SpO2 98%  Vital signs reviewed  General: Well appearing, no distress  Assessment and Plan    Gastro-esophageal reflux Controlled on Dexilant. Refill prescription today.  Anxiety Appears highly associated with work stress. He is now a little more  stressed because he is losing part of his company. Xanax has worked in the past for him. Will add Xanax 0.25mg  in the day to his regimen. If appears he may need chronic management of this, will consider switching to an SSRI.  Insomnia Appears secondary to work stress. Xanax has been helping. Discussed behavior modification, however, patient feels Xanax works well for him. Will try increasing dose to Xanax 0.5mg  qhs prn.   Stephen Peters a Stephen Peters.D.

## 2015-10-18 NOTE — Assessment & Plan Note (Signed)
Controlled on Dexilant. Refill prescription today.

## 2015-10-18 NOTE — Assessment & Plan Note (Signed)
Appears secondary to work stress. Xanax has been helping. Discussed behavior modification, however, patient feels Xanax works well for him. Will try increasing dose to Xanax 0.5mg  qhs prn.

## 2016-01-17 ENCOUNTER — Telehealth: Payer: Self-pay

## 2016-01-17 NOTE — Telephone Encounter (Signed)
PA completed on covermymeds for dexilant. Only other med that I see pt has tried for GERD is omeprazole and tums. Pt has been using dexilant since 04/20/13. PA pending.

## 2016-01-20 NOTE — Telephone Encounter (Signed)
PA was denied because pt has not tried/failed at least 2 alternative meds on formulary, which are omeprazole, pantoprazole, lansoprazole and esomeprazole. Of these I could only find documentation for omeprazole. Dr Everlene Farrier, do you want to Rx one of these others?

## 2016-01-21 NOTE — Telephone Encounter (Signed)
Call patient and tell him the situation they were not to get the medication he is currently on. Is he willing to try different 1. If so call and protonix 40 mg 1 a day. #30. Refill one year.

## 2016-01-23 ENCOUNTER — Other Ambulatory Visit: Payer: Self-pay | Admitting: Emergency Medicine

## 2016-01-23 MED ORDER — DEXLANSOPRAZOLE 30 MG PO CPDR: DELAYED_RELEASE_CAPSULE | ORAL | Status: DC

## 2016-01-23 NOTE — Telephone Encounter (Signed)
Per pt his insurance no longer pays for any of his medication so he has been paying for the [employer] for about 2 years now. Per pt he would like to stay on the medication. Is it ok for you to refill medication.

## 2016-01-23 NOTE — Telephone Encounter (Signed)
Tell patient I printed a prescription for him and will mail it to him if that is what he would like.

## 2016-01-24 NOTE — Telephone Encounter (Signed)
Pt would like this mailed and Pamala Hurry spoke to pt.

## 2016-03-15 DIAGNOSIS — M545 Low back pain: Secondary | ICD-10-CM | POA: Diagnosis not present

## 2016-03-15 DIAGNOSIS — M9901 Segmental and somatic dysfunction of cervical region: Secondary | ICD-10-CM | POA: Diagnosis not present

## 2016-03-15 DIAGNOSIS — M9903 Segmental and somatic dysfunction of lumbar region: Secondary | ICD-10-CM | POA: Diagnosis not present

## 2016-03-15 DIAGNOSIS — M9902 Segmental and somatic dysfunction of thoracic region: Secondary | ICD-10-CM | POA: Diagnosis not present

## 2016-04-17 ENCOUNTER — Other Ambulatory Visit: Payer: Self-pay | Admitting: Emergency Medicine

## 2016-04-17 DIAGNOSIS — F419 Anxiety disorder, unspecified: Secondary | ICD-10-CM

## 2016-04-17 DIAGNOSIS — G4700 Insomnia, unspecified: Secondary | ICD-10-CM

## 2016-04-18 NOTE — Telephone Encounter (Signed)
Pharm reqs RF of alprazolam. Pended. Last OV and Rx 10/18/15 for #40 + 5 RFs.

## 2016-04-19 NOTE — Telephone Encounter (Signed)
Called in.

## 2016-05-28 DIAGNOSIS — M9902 Segmental and somatic dysfunction of thoracic region: Secondary | ICD-10-CM | POA: Diagnosis not present

## 2016-05-28 DIAGNOSIS — M545 Low back pain: Secondary | ICD-10-CM | POA: Diagnosis not present

## 2016-05-28 DIAGNOSIS — M9901 Segmental and somatic dysfunction of cervical region: Secondary | ICD-10-CM | POA: Diagnosis not present

## 2016-05-28 DIAGNOSIS — M9903 Segmental and somatic dysfunction of lumbar region: Secondary | ICD-10-CM | POA: Diagnosis not present

## 2016-06-12 ENCOUNTER — Other Ambulatory Visit: Payer: Self-pay | Admitting: Emergency Medicine

## 2016-06-12 DIAGNOSIS — G4700 Insomnia, unspecified: Secondary | ICD-10-CM

## 2016-06-12 DIAGNOSIS — F419 Anxiety disorder, unspecified: Secondary | ICD-10-CM

## 2016-06-12 NOTE — Telephone Encounter (Signed)
Last OV in March, last RF 9/6 #40 + 1 RF. No appt scheduled

## 2016-06-15 NOTE — Telephone Encounter (Signed)
Refill of Xanax denied.  Pt is overdue for six month follow-up AND Dr. Everlene Farrier has retired so patient needs to establish with a new provider.  His prescription was extended in 04/2016 for two months to allow him time to return for a visit.

## 2016-06-19 NOTE — Telephone Encounter (Signed)
Lm with dr Darliss Ridgel comments, needs ov

## 2016-08-01 DIAGNOSIS — M9902 Segmental and somatic dysfunction of thoracic region: Secondary | ICD-10-CM | POA: Diagnosis not present

## 2016-08-01 DIAGNOSIS — M9901 Segmental and somatic dysfunction of cervical region: Secondary | ICD-10-CM | POA: Diagnosis not present

## 2016-08-01 DIAGNOSIS — M545 Low back pain: Secondary | ICD-10-CM | POA: Diagnosis not present

## 2016-08-01 DIAGNOSIS — M9903 Segmental and somatic dysfunction of lumbar region: Secondary | ICD-10-CM | POA: Diagnosis not present

## 2016-08-15 ENCOUNTER — Telehealth: Payer: Self-pay

## 2016-08-15 NOTE — Telephone Encounter (Signed)
Fax from pharm advises dexilant needs PA. See notes under PA for this back in June 2017. At that time pt wanted to continue paying out of pocket for this even though ins would not cover it. I don't see any other PPIs Rxd previously. Called pharm to see if pt has been paying OOP and they stated that it was covered in Nov by [facility]. Called and LMOM for pt to CB. Has he ever tried any other PPIs that were ineffective? If not, has his ins/formulary changed so that dexilant was covered in June but not now?

## 2016-08-21 ENCOUNTER — Ambulatory Visit (INDEPENDENT_AMBULATORY_CARE_PROVIDER_SITE_OTHER): Payer: Self-pay | Admitting: Physician Assistant

## 2016-08-21 VITALS — BP 137/86 | HR 96 | Temp 97.9°F | Resp 16 | Ht 70.0 in | Wt 167.4 lb

## 2016-08-21 DIAGNOSIS — G4700 Insomnia, unspecified: Secondary | ICD-10-CM | POA: Diagnosis not present

## 2016-08-21 DIAGNOSIS — K219 Gastro-esophageal reflux disease without esophagitis: Secondary | ICD-10-CM

## 2016-08-21 DIAGNOSIS — F419 Anxiety disorder, unspecified: Secondary | ICD-10-CM | POA: Diagnosis not present

## 2016-08-21 DIAGNOSIS — Z79899 Other long term (current) drug therapy: Secondary | ICD-10-CM

## 2016-08-21 DIAGNOSIS — E785 Hyperlipidemia, unspecified: Secondary | ICD-10-CM

## 2016-08-21 MED ORDER — LANSOPRAZOLE 15 MG PO CPDR: 15.0000 mg | DELAYED_RELEASE_CAPSULE | Freq: Every day | ORAL | 1 refills | Status: DC

## 2016-08-21 MED ORDER — ALPRAZOLAM 0.5 MG PO TABS: ORAL_TABLET | ORAL | 1 refills | Status: DC

## 2016-08-21 NOTE — Patient Instructions (Addendum)
  Pick up your new prescription at your pharmacy. If 45m is not working for you, you may increase dose to 315m Follow-up with me in one month.     Thank you for coming in today. I hope you feel we met your needs.  Feel free to call [facility] if you have any questions or further requests.  Please consider signing up for MyChart if you do not already have it, as this is a great way to communicate with me.  Best,  Whitney McVey, PA-C  IF you received an x-ray today, you will receive an invoice from [facility]. Please contact [facility] at [phone] questions or concerns regarding your invoice.   IF you received labwork today, you will receive an invoice from [facility] at [phone] questions or concerns regarding your invoice.   Our billing staff will not be able to assist you with questions regarding bills from these companies.  You will be contacted with the lab results as soon as they are available. The fastest way to get your results is to activate your My Chart account. Instructions are located on the last page of this paperwork. If you have not heard from [facility] the results in 2 weeks, please contact this office.

## 2016-08-21 NOTE — Progress Notes (Signed)
Jobin Lebert  MRN: [id number] DOB: Oct 11, 1960  PCP: Jenny Reichmann, MD  Subjective:  Pt is a 56 year old male PMH GERD, HLD, anxiety and insomnia who presents to clinic for medication management.   Anxiety - Well managed with Xanax 0.5 mg. No side effects. Takes this at midday. He is still going through a divorce.   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 He often has racing thoughts at night and is stressed about work. Controlled with Xanax 0.25 mg. Getting good sleep.   GERD - Currently takes Dexilant 30 mg. Has been on this medication since 04/20/13.  Working well.  Without this he chokes in his sleep. He tries not to eat after 5 pm. Insurance will not cover this medication, as he has not tried other medications - requires PA. He has tried/failed Omeprazole.    HLD - No longer taking Lipitor due to side effects.   Review of Systems  Constitutional: Negative for chills, diaphoresis and fever.  Respiratory: Negative for cough, chest tightness, shortness of breath and wheezing.   Cardiovascular: Negative for chest pain and palpitations.  Gastrointestinal: Positive for abdominal pain. Negative for diarrhea, nausea and vomiting.  Neurological: Negative for dizziness, syncope, light-headedness and headaches.  Psychiatric/Behavioral: Positive for sleep disturbance. The patient is nervous/anxious.     Patient Active Problem List   Diagnosis Date Noted  . Anxiety 10/18/2015  . Insomnia 10/18/2015  . Hypercholesteremia 06/08/2014  . Nonspecific abnormal electrocardiogram (ECG) (EKG) 06/08/2014  . Gastro-esophageal reflux 04/14/2014  . Environmental allergies 04/14/2014    Current Outpatient Prescriptions on File Prior to Visit  Medication Sig Dispense Refill  . ALPRAZolam (XANAX) 0.5 MG tablet TAKE 1/2 TABLET BY MOUTH DURING THE DAY IF NEEDED FOR STRESS AND 1 TABLET AT NIGHT 40 tablet 1  . Dexlansoprazole (DEXILANT) 30 MG capsule TAKE ONE  CAPSULE BY MOUTH EVERY DAY 30 capsule 11  . atorvastatin (LIPITOR) 40 MG tablet TAKE 1 TABLET BY MOUTH EVERY DAY (Patient not taking: Reported on 08/21/2016) 30 tablet 11   No current facility-administered medications on file prior to visit.     No Known Allergies   Objective:  BP 137/86 (BP Location: Right Arm, Patient Position: Sitting, Cuff Size: Small)   Pulse 96   Temp 97.9 F (36.6 C) (Oral)   Resp 16   Ht 5\' 10"  (1.778 m)   Wt 167 lb 6.4 oz (75.9 kg)   SpO2 96%   BMI 24.02 kg/m   Physical Exam  Constitutional: He is oriented to person, place, and time and well-developed, well-nourished, and in no distress. No distress.  Cardiovascular: Normal rate, regular rhythm and normal heart sounds.   Pulmonary/Chest: Effort normal. No respiratory distress.  Abdominal: Soft. There is no tenderness.  Neurological: He is alert and oriented to person, place, and time. GCS score is 15.  Skin: Skin is warm and dry.  Psychiatric: Mood, memory, affect and judgment normal.  Vitals reviewed.   Assessment and Plan :  1. Encounter for medication management 2. Anxiety 3. Insomnia, unspecified type - ALPRAZolam (XANAX) 0.5 MG tablet; TAKE 1/2 TABLET BY MOUTH DURING THE DAY IF NEEDED FOR STRESS AND 1 TABLET AT NIGHT  Dispense: 40 tablet; Refill: 1 4. Gastroesophageal reflux disease without esophagitis - lansoprazole (PREVACID) 15 MG capsule; Take 1 capsule (15 mg total) by mouth daily at 12 noon. Max dose: 30 mg  Dispense: 30 capsule; Refill: 1 - Will try  pt on Prevacid. PA for dexilant requires trial of at least 2 other PPIs.  5. Dyslipidemia - Pt agrees to annual exam later this year. He discontinued himself from Lipitor due to side effects. Will draw labs at next visit.    Mercer Pod, PA-C  [facility] 08/21/2016 1:51 PM

## 2016-09-04 DIAGNOSIS — M9902 Segmental and somatic dysfunction of thoracic region: Secondary | ICD-10-CM | POA: Diagnosis not present

## 2016-09-04 DIAGNOSIS — M545 Low back pain: Secondary | ICD-10-CM | POA: Diagnosis not present

## 2016-09-04 DIAGNOSIS — M9901 Segmental and somatic dysfunction of cervical region: Secondary | ICD-10-CM | POA: Diagnosis not present

## 2016-09-04 DIAGNOSIS — M9903 Segmental and somatic dysfunction of lumbar region: Secondary | ICD-10-CM | POA: Diagnosis not present

## 2016-10-01 DIAGNOSIS — M9901 Segmental and somatic dysfunction of cervical region: Secondary | ICD-10-CM | POA: Diagnosis not present

## 2016-10-01 DIAGNOSIS — M545 Low back pain: Secondary | ICD-10-CM | POA: Diagnosis not present

## 2016-10-01 DIAGNOSIS — M9902 Segmental and somatic dysfunction of thoracic region: Secondary | ICD-10-CM | POA: Diagnosis not present

## 2016-10-01 DIAGNOSIS — M9903 Segmental and somatic dysfunction of lumbar region: Secondary | ICD-10-CM | POA: Diagnosis not present

## 2016-10-02 ENCOUNTER — Ambulatory Visit (INDEPENDENT_AMBULATORY_CARE_PROVIDER_SITE_OTHER): Payer: BLUE CROSS/BLUE SHIELD | Admitting: Urgent Care

## 2016-10-02 VITALS — BP 128/72 | HR 67 | Temp 98.5°F | Resp 18 | Ht 70.0 in | Wt 167.0 lb

## 2016-10-02 DIAGNOSIS — K219 Gastro-esophageal reflux disease without esophagitis: Secondary | ICD-10-CM | POA: Diagnosis not present

## 2016-10-02 DIAGNOSIS — R142 Eructation: Secondary | ICD-10-CM

## 2016-10-02 MED ORDER — DEXLANSOPRAZOLE 30 MG PO CPDR: DELAYED_RELEASE_CAPSULE | ORAL | 3 refills | Status: DC

## 2016-10-02 NOTE — Progress Notes (Signed)
  MRN: [id number] DOB: 09-08-1960  Subjective:   Stephen Peters is a 56 y.o. male presenting for chief complaint of Medication Problem (prevacid not working)  Patient presents requesting a script for Stephen Peters. He was previously switched to lansoprazole. This worked initially for 2 weeks but has since had problems with his GERD again. Reports burping, esophageal discomfort worst at night with foods like bacon, fish. He would like to get a script for Dexilant and plans to pay out of pocket. Denies fever, sour brash, chest pain, difficulty swallowing, epigastric pain, abdominal pain, n/v. He has never been worked up by GI, had an endoscopy and is not interested in referral for now.   Stephen Peters has a current medication list which includes the following prescription(s): alprazolam, lansoprazole, atorvastatin, and dexlansoprazole. Also has No Known Allergies.  Stephen Peters  has a past medical history of Allergy and Cataract. Also  has a past surgical history that includes Eye surgery.  Objective:   Vitals: BP 128/72 (BP Location: Right Arm, Patient Position: Sitting, Cuff Size: Small)   Pulse 67   Temp 98.5 F (36.9 C) (Oral)   Resp 18   Ht 5\' 10"  (1.778 m)   Wt 167 lb (75.8 kg)   SpO2 97%   BMI 23.96 kg/m   Physical Exam  Constitutional: He is oriented to person, place, and time. He appears well-developed and well-nourished.  Cardiovascular: Normal rate.   Pulmonary/Chest: Effort normal.  Neurological: He is alert and oriented to person, place, and time.   Assessment and Plan :   1. Gastroesophageal reflux disease, esophagitis presence not specified 2. Burping - Provided refill for patient. He refused further work up for now but will let me know if Dexilant does not work.    Jaynee Eagles, PA-C [facility] [id number] 10/02/2016  11:41 AM

## 2016-10-17 NOTE — Telephone Encounter (Signed)
Xanax script destroyed Pt did not pick up 07/12/15

## 2016-10-30 ENCOUNTER — Telehealth: Payer: Self-pay | Admitting: Physician Assistant

## 2016-10-30 ENCOUNTER — Other Ambulatory Visit: Payer: Self-pay | Admitting: Urgent Care

## 2016-10-30 DIAGNOSIS — R142 Eructation: Secondary | ICD-10-CM

## 2016-10-30 DIAGNOSIS — K219 Gastro-esophageal reflux disease without esophagitis: Secondary | ICD-10-CM

## 2016-10-30 DIAGNOSIS — F419 Anxiety disorder, unspecified: Secondary | ICD-10-CM

## 2016-10-30 DIAGNOSIS — G4700 Insomnia, unspecified: Secondary | ICD-10-CM

## 2016-10-30 NOTE — Telephone Encounter (Signed)
PATIENT WOULD LIKE Stephen Peters TO KNOW THAT THE PHARMACY ONLY GAVE HIM 30 DAYS OF THE DEXILANT 30 MG. HE SAID WHEN HE SAW Stephen IN FEB. 2018, HE THOUGHT HE TOLD HIM THAT HE WOULD GIVE HIM ENOUGH FOR 18 MONTHS. ALSO HE IS ALMOST OUT OF HIS ALPRAZOLAM 0.5 MG THAT WAS GIVEN TO HIM LAST BY Stephen Peters. HIS PHARMACY HAS SENT OVER A REQUEST FOR THAT. BEST PHONE [phone] (CELL) [facility] [address]. Stephen Peters

## 2016-10-31 ENCOUNTER — Other Ambulatory Visit: Payer: Self-pay | Admitting: Physician Assistant

## 2016-10-31 DIAGNOSIS — G4700 Insomnia, unspecified: Secondary | ICD-10-CM

## 2016-10-31 DIAGNOSIS — F419 Anxiety disorder, unspecified: Secondary | ICD-10-CM

## 2016-10-31 MED ORDER — DEXLANSOPRAZOLE 30 MG PO CPDR: DELAYED_RELEASE_CAPSULE | ORAL | 3 refills | Status: DC

## 2016-10-31 NOTE — Telephone Encounter (Signed)
I did prescribe 90 day supply with 3 refills for Dexilant. Please let the patient know that the pharmacy may have tried to refill through an old script they had. I will send the script again for the same. Also, I cannot refill his Xanax. I do not do refills for controlled substances for patients I am not managing their mental health. He is welcome to come back and see the provider who previously filled his script for Xanax.

## 2016-10-31 NOTE — Telephone Encounter (Signed)
Script request sent to PA-McVey.

## 2016-11-07 NOTE — Telephone Encounter (Signed)
Tried to get PA on patient's medication, however, it was not approved. Please advise.

## 2016-11-08 NOTE — Telephone Encounter (Signed)
Whitney, Patient called today requesting a refill on his alprazolam.  He said that he uses it for sleep and is having a hard time without it.  [facility] [address] [facility].  If not, just let me know and I will call him back. Thank you,  Martine Bleecker

## 2016-11-12 NOTE — Telephone Encounter (Signed)
08/21/16 last ov and refill

## 2016-11-12 NOTE — Telephone Encounter (Signed)
Patient called stating he has been waiting for almost 2 weeks now for this medication and he is completely out and would like for [facility] [address] [facility].  Thank you!

## 2016-11-13 NOTE — Telephone Encounter (Signed)
[facility] 

## 2016-11-14 NOTE — Telephone Encounter (Signed)
Pt made aware medication is at pharmacy and ready for pick up

## 2016-11-14 NOTE — Telephone Encounter (Signed)
Pt would like for xanax RX to [facility]  It showed that it was printed out but know one find the sheet

## 2016-11-14 NOTE — Telephone Encounter (Signed)
[facility]  Verified order/ fill per pharmacist Pt made aware

## 2016-11-20 DIAGNOSIS — M9903 Segmental and somatic dysfunction of lumbar region: Secondary | ICD-10-CM | POA: Diagnosis not present

## 2016-11-20 DIAGNOSIS — M9901 Segmental and somatic dysfunction of cervical region: Secondary | ICD-10-CM | POA: Diagnosis not present

## 2016-11-20 DIAGNOSIS — M9902 Segmental and somatic dysfunction of thoracic region: Secondary | ICD-10-CM | POA: Diagnosis not present

## 2016-11-20 DIAGNOSIS — M545 Low back pain: Secondary | ICD-10-CM | POA: Diagnosis not present

## 2016-12-20 ENCOUNTER — Ambulatory Visit (INDEPENDENT_AMBULATORY_CARE_PROVIDER_SITE_OTHER): Payer: BLUE CROSS/BLUE SHIELD | Admitting: Physician Assistant

## 2016-12-20 ENCOUNTER — Encounter: Payer: Self-pay | Admitting: Physician Assistant

## 2016-12-20 DIAGNOSIS — F419 Anxiety disorder, unspecified: Secondary | ICD-10-CM

## 2016-12-20 DIAGNOSIS — R142 Eructation: Secondary | ICD-10-CM

## 2016-12-20 DIAGNOSIS — G4700 Insomnia, unspecified: Secondary | ICD-10-CM | POA: Diagnosis not present

## 2016-12-20 DIAGNOSIS — K219 Gastro-esophageal reflux disease without esophagitis: Secondary | ICD-10-CM | POA: Diagnosis not present

## 2016-12-20 MED ORDER — HYDROXYZINE HCL 25 MG PO TABS: 12.5000 mg | ORAL_TABLET | Freq: Every day | ORAL | 0 refills | Status: DC | PRN

## 2016-12-20 MED ORDER — ESCITALOPRAM OXALATE 5 MG PO TABS: 5.0000 mg | ORAL_TABLET | Freq: Every day | ORAL | 0 refills | Status: DC

## 2016-12-20 MED ORDER — ALPRAZOLAM 0.5 MG PO TABS: 0.5000 mg | ORAL_TABLET | Freq: Every evening | ORAL | 1 refills | Status: DC | PRN

## 2016-12-20 NOTE — Progress Notes (Signed)
12/20/2016 3:19 PM   DOB: 1961/04/08 / MRN: [id number]  SUBJECTIVE:  Stephen Peters is a 56 y.o. male presenting for Xanax refills.  Normally sees PA McVey who states his anxiety symptoms are well controlled on benzodiazepine monotherapy. CHL shows his last refill was about this time last month.  He is prescribed 30 tabs to take nightly and 10 tabs to take 1/2 daily BID for anxiousness.   He tells me that he takes Xanax for sleep and mind racing and if he does not take this he will wake up "100 times." Tells me he will sleep for 5 hours with taking 1 xanax night.  Owns his own business.  Will often need to take 1/2 a tab daily which varies with stress levels.  Says that summer time and vacations are difficult for him and employs 40 people. Tells me he has always been "hot headed."   Depression screen [facility] 2/9 12/20/2016  Decreased Interest 0  Down, Depressed, Hopeless 0  PHQ - 2 Score 0     He has No Known Allergies.   He  has a past medical history of Allergy and Cataract.    He  reports that he quit smoking about 4 years ago. His smoking use included Cigarettes. He has a 30.00 pack-year smoking history. He has never used smokeless tobacco. He reports that he drinks alcohol. He reports that he does not use drugs. He  reports that he currently engages in sexual activity. He reports using the following method of birth control/protection: None. The patient  has a past surgical history that includes Eye surgery.  His family history is not on file.  Review of Systems  Constitutional: Negative for chills and fever.  Skin: Negative for rash.  Neurological: Negative for dizziness.  Psychiatric/Behavioral: Negative for depression, hallucinations, memory loss, substance abuse and suicidal ideas. The patient is nervous/anxious and has insomnia.     The problem list and medications were reviewed and updated by myself where necessary and exist elsewhere in the encounter.   OBJECTIVE:  BP 120/77 (BP  Location: Right Arm, Patient Position: Sitting, Cuff Size: Small)   Pulse 86   Temp 98.8 F (37.1 C) (Oral)   Resp 18   Ht 5\' 10"  (1.778 m)   Wt 164 lb (74.4 kg)   SpO2 95%   BMI 23.53 kg/m   Physical Exam  Constitutional: He is oriented to person, place, and time.  Cardiovascular: Normal rate.   Pulmonary/Chest: Effort normal and breath sounds normal.  Musculoskeletal: Normal range of motion.  Neurological: He is alert and oriented to person, place, and time.  Skin: Skin is warm and dry.  Psychiatric: He has a normal mood and affect.      No results found for this or any previous visit (from the past 72 hour(s)).  No results found.  ASSESSMENT AND PLAN:  Raydon was seen today for medication refill.  Diagnoses and all orders for this visit:  Anxiety: He wants to try some other therapies and stop his xanax.  Starting 5 mg lexapro along with Hydrox and continuing his xanax at night as tracing thought at night seem to be his biggest problem.  My hope is that he will find that he no longer needs xanax or hydrox.  -     ALPRAZolam (XANAX) 0.5 MG tablet; Take 1 tablet (0.5 mg total) by mouth 2 (two) times daily as needed for anxiety.  Insomnia, unspecified type -     ALPRAZolam (XANAX) 0.5  MG tablet; Take 1 tablet (0.5 mg total) by mouth 2 (two) times daily as needed for anxiety.    The patient is advised to call or return to clinic if he does not see an improvement in symptoms, or to seek the care of the closest emergency department if he worsens with the above plan.   Philis Fendt, MHS, PA-C [facility] 12/20/2016 3:19 PM

## 2016-12-20 NOTE — Patient Instructions (Signed)
     IF you received an x-ray today, you will receive an invoice from [facility]. Please contact [facility] at [phone] with questions or concerns regarding your invoice.   IF you received labwork today, you will receive an invoice from [facility]. Please contact [facility] at [phone] with questions or concerns regarding your invoice.   Our billing staff will not be able to assist you with questions regarding bills from these companies.  You will be contacted with the lab results as soon as they are available. The fastest way to get your results is to activate your My Chart account. Instructions are located on the last page of this paperwork. If you have not heard from us regarding the results in 2 weeks, please contact this office.     

## 2017-01-16 DIAGNOSIS — J209 Acute bronchitis, unspecified: Secondary | ICD-10-CM | POA: Diagnosis not present

## 2017-01-16 DIAGNOSIS — J189 Pneumonia, unspecified organism: Secondary | ICD-10-CM | POA: Diagnosis not present

## 2017-01-21 DIAGNOSIS — M9903 Segmental and somatic dysfunction of lumbar region: Secondary | ICD-10-CM | POA: Diagnosis not present

## 2017-01-21 DIAGNOSIS — M9901 Segmental and somatic dysfunction of cervical region: Secondary | ICD-10-CM | POA: Diagnosis not present

## 2017-01-21 DIAGNOSIS — M545 Low back pain: Secondary | ICD-10-CM | POA: Diagnosis not present

## 2017-01-21 DIAGNOSIS — M9902 Segmental and somatic dysfunction of thoracic region: Secondary | ICD-10-CM | POA: Diagnosis not present

## 2017-02-18 ENCOUNTER — Telehealth: Payer: Self-pay | Admitting: Physician Assistant

## 2017-02-20 NOTE — Telephone Encounter (Signed)
I will do a courtesy refill in PA-Clark's absence. Please schedule patient for f/u with PA-Clark.

## 2017-02-20 NOTE — Telephone Encounter (Signed)
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 Thank you

## 2017-02-21 NOTE — Telephone Encounter (Signed)
SPOKE WITH PATIENT HE STATES THAT HE DOESN'T NEED NONE OF THE MEDICINES THAT YOU REFILLED STATES THAT THE MEDICINES DO NOTHING FOR HIM HE MADE AN APPOINTMENT WITH [facility] ON Saturday FOR REFILL ON [facility]

## 2017-02-23 ENCOUNTER — Ambulatory Visit (INDEPENDENT_AMBULATORY_CARE_PROVIDER_SITE_OTHER): Payer: BLUE CROSS/BLUE SHIELD | Admitting: Physician Assistant

## 2017-02-23 ENCOUNTER — Encounter: Payer: Self-pay | Admitting: Physician Assistant

## 2017-02-23 VITALS — BP 136/82 | HR 72 | Temp 98.2°F | Resp 16 | Ht 70.0 in | Wt 160.2 lb

## 2017-02-23 DIAGNOSIS — N529 Male erectile dysfunction, unspecified: Secondary | ICD-10-CM | POA: Diagnosis not present

## 2017-02-23 DIAGNOSIS — F411 Generalized anxiety disorder: Secondary | ICD-10-CM

## 2017-02-23 MED ORDER — TADALAFIL 20 MG PO TABS: 10.0000 mg | ORAL_TABLET | ORAL | 5 refills | Status: DC | PRN

## 2017-02-23 MED ORDER — ESCITALOPRAM OXALATE 5 MG PO TABS: 5.0000 mg | ORAL_TABLET | Freq: Every day | ORAL | 3 refills | Status: DC

## 2017-02-23 MED ORDER — ALPRAZOLAM 0.5 MG PO TABS: 0.5000 mg | ORAL_TABLET | Freq: Every evening | ORAL | 5 refills | Status: DC | PRN

## 2017-02-23 NOTE — Progress Notes (Signed)
02/23/2017 2:07 PM   DOB: 1961-05-12 / MRN: [id number]  SUBJECTIVE:  Stephen Peters is a 56 y.o. male presenting for medication refills. He would also like to have cialis again.    With regard to his cialis.  Tells me that he will have no difficulty with having an initial orgasm, but without the medication but will have difficulty after that intial orgasm with having an erection in the next 2-3 days. He is with a new partner and would like to be able to engage in sexual acitvity more than 1 time every 5-6 days.   He has No Known Allergies.   He  has a past medical history of Allergy and Cataract.    He  reports that he quit smoking about 4 years ago. His smoking use included Cigarettes. He has a 30.00 pack-year smoking history. He has never used smokeless tobacco. He reports that he drinks alcohol. He reports that he does not use drugs. He  reports that he currently engages in sexual activity. He reports using the following method of birth control/protection: None. The patient  has a past surgical history that includes Eye surgery.  His family history is not on file.  Review of Systems  Constitutional: Negative for chills and fever.  Cardiovascular: Negative for chest pain.  Skin: Negative for itching and rash.  Psychiatric/Behavioral: Negative for depression, hallucinations, memory loss, substance abuse and suicidal ideas. The patient is nervous/anxious and has insomnia.     The problem list and medications were reviewed and updated by myself where necessary and exist elsewhere in the encounter.   OBJECTIVE:  BP 136/82   Pulse 72   Temp 98.2 F (36.8 C) (Oral)   Resp 16   Ht 5\' 10"  (1.778 m)   Wt 160 lb 3.2 oz (72.7 kg)   SpO2 98%   BMI 22.99 kg/m   Physical Exam  Constitutional: He is oriented to person, place, and time. He appears well-developed. He is active and cooperative.  Non-toxic appearance.  Eyes: Pupils are equal, round, and reactive to light. EOM are normal.    Cardiovascular: Normal rate.   Pulmonary/Chest: Effort normal and breath sounds normal. No tachypnea.  Neurological: He is alert and oriented to person, place, and time. He has normal strength and normal reflexes. He is not disoriented. No cranial nerve deficit or sensory deficit. He exhibits normal muscle tone. Coordination and gait normal.  Skin: Skin is warm and dry. He is not diaphoretic. No pallor.  Psychiatric: He has a normal mood and affect. His behavior is normal. Judgment and thought content normal.  Vitals reviewed.   No results found for this or any previous visit (from the past 72 hour(s)).  No results found.  ASSESSMENT AND PLAN:  Stephen Peters was seen today for insomnia.  Diagnoses and all orders for this visit:  GAD (generalized anxiety disorder) -     escitalopram (LEXAPRO) 5 MG tablet; Take 1 tablet (5 mg total) by mouth daily. -     ALPRAZolam (XANAX) 0.5 MG tablet; Take 1 tablet (0.5 mg total) by mouth at bedtime as needed for anxiety.  Erectile dysfunction, unspecified erectile dysfunction type -     tadalafil (CIALIS) 20 MG tablet; Take 0.5-1 tablets (10-20 mg total) by mouth every other day as needed for erectile dysfunction.    The patient is advised to call or return to clinic if he does not see an improvement in symptoms, or to seek the care of the closest emergency  department if he worsens with the above plan.   Philis Fendt, MHS, PA-C [facility] 02/23/2017 2:07 PM

## 2017-02-23 NOTE — Patient Instructions (Signed)
     IF you received an x-ray today, you will receive an invoice from [facility]. Please contact [facility] at [phone] with questions or concerns regarding your invoice.   IF you received labwork today, you will receive an invoice from [facility]. Please contact [facility] at [phone] with questions or concerns regarding your invoice.   Our billing staff will not be able to assist you with questions regarding bills from these companies.  You will be contacted with the lab results as soon as they are available. The fastest way to get your results is to activate your My Chart account. Instructions are located on the last page of this paperwork. If you have not heard from us regarding the results in 2 weeks, please contact this office.     

## 2017-02-27 ENCOUNTER — Telehealth: Payer: Self-pay

## 2017-02-27 ENCOUNTER — Other Ambulatory Visit: Payer: Self-pay

## 2017-02-27 DIAGNOSIS — N529 Male erectile dysfunction, unspecified: Secondary | ICD-10-CM

## 2017-02-27 MED ORDER — TADALAFIL 20 MG PO TABS: 10.0000 mg | ORAL_TABLET | ORAL | 5 refills | Status: DC | PRN

## 2017-02-27 NOTE — Telephone Encounter (Signed)
Please reorder and change pharmacy. I will cosign. Thank you [place]. - [place]

## 2017-02-27 NOTE — Telephone Encounter (Signed)
Cialis Rejected    **[facility] in [facility] has Cialis for $2 per pill

## 2017-02-27 NOTE — Telephone Encounter (Signed)
Completed. Pt notified.  ?

## 2017-03-01 ENCOUNTER — Telehealth: Payer: Self-pay

## 2017-03-01 DIAGNOSIS — N529 Male erectile dysfunction, unspecified: Secondary | ICD-10-CM

## 2017-03-01 MED ORDER — TADALAFIL 20 MG PO TABS: 10.0000 mg | ORAL_TABLET | ORAL | 5 refills | Status: DC | PRN

## 2017-03-01 NOTE — Telephone Encounter (Signed)
Received fax from [facility] stating that they do not carry Cialis, they have recommended change to Sildenafil. Spoke with patient, states that [facility] [address] [facility] [place]. Per provider, [facility]./ S.Adorian Gwynne,CMA

## 2017-03-07 ENCOUNTER — Telehealth: Payer: Self-pay

## 2017-03-07 ENCOUNTER — Other Ambulatory Visit: Payer: Self-pay | Admitting: Physician Assistant

## 2017-03-07 MED ORDER — SILDENAFIL CITRATE 20 MG PO TABS: 20.0000 mg | ORAL_TABLET | Freq: Every day | ORAL | 6 refills | Status: DC | PRN

## 2017-03-07 MED ORDER — POLYETHYL GLYCOL-PROPYL GLYCOL 0.4-0.3 % OP GEL: 1.0000 "application " | Freq: Every day | OPHTHALMIC | 0 refills | Status: DC

## 2017-03-07 MED ORDER — POLYMYXIN B-TRIMETHOPRIM 10000-0.1 UNIT/ML-% OP SOLN: 1.0000 [drp] | Freq: Four times a day (QID) | OPHTHALMIC | 0 refills | Status: DC

## 2017-03-07 NOTE — Telephone Encounter (Signed)
Spoke with patient, states that he would like [facility] [address] in [place], erx'd at this time./ S.Landrum Carbonell,CMA

## 2017-03-07 NOTE — Telephone Encounter (Signed)
Received notice from [facility] [place] stating that they will not cover rx for Cialis without a trial and failure of Viagra. Spoke with patient, states that he would like to try the Viagra first. Awaiting PA authorization for medication change./ S.Thelmer Legler,CMA

## 2017-03-07 NOTE — Telephone Encounter (Signed)
I've placed his prescription on your computer. Philis Fendt, MS, PA-C 6:09 PM, 03/07/2017

## 2017-03-21 ENCOUNTER — Other Ambulatory Visit: Payer: Self-pay | Admitting: Physician Assistant

## 2017-03-21 DIAGNOSIS — K219 Gastro-esophageal reflux disease without esophagitis: Secondary | ICD-10-CM

## 2017-04-10 DIAGNOSIS — M9903 Segmental and somatic dysfunction of lumbar region: Secondary | ICD-10-CM | POA: Diagnosis not present

## 2017-04-10 DIAGNOSIS — M9902 Segmental and somatic dysfunction of thoracic region: Secondary | ICD-10-CM | POA: Diagnosis not present

## 2017-04-10 DIAGNOSIS — M9901 Segmental and somatic dysfunction of cervical region: Secondary | ICD-10-CM | POA: Diagnosis not present

## 2017-04-10 DIAGNOSIS — M545 Low back pain: Secondary | ICD-10-CM | POA: Diagnosis not present

## 2017-04-22 ENCOUNTER — Other Ambulatory Visit: Payer: Self-pay | Admitting: Urgent Care

## 2017-05-06 ENCOUNTER — Ambulatory Visit (INDEPENDENT_AMBULATORY_CARE_PROVIDER_SITE_OTHER): Payer: BLUE CROSS/BLUE SHIELD | Admitting: Physician Assistant

## 2017-05-06 ENCOUNTER — Ambulatory Visit (INDEPENDENT_AMBULATORY_CARE_PROVIDER_SITE_OTHER): Payer: BLUE CROSS/BLUE SHIELD

## 2017-05-06 ENCOUNTER — Encounter: Payer: Self-pay | Admitting: Physician Assistant

## 2017-05-06 VITALS — BP 128/72 | HR 83 | Temp 98.0°F | Resp 16 | Ht 70.0 in | Wt 165.6 lb

## 2017-05-06 DIAGNOSIS — J029 Acute pharyngitis, unspecified: Secondary | ICD-10-CM | POA: Diagnosis not present

## 2017-05-06 DIAGNOSIS — C44519 Basal cell carcinoma of skin of other part of trunk: Secondary | ICD-10-CM | POA: Diagnosis not present

## 2017-05-06 DIAGNOSIS — I6523 Occlusion and stenosis of bilateral carotid arteries: Secondary | ICD-10-CM

## 2017-05-06 DIAGNOSIS — T148XXD Other injury of unspecified body region, subsequent encounter: Secondary | ICD-10-CM | POA: Diagnosis not present

## 2017-05-06 LAB — POCT RAPID STREP A (OFFICE): Rapid Strep A Screen: NEGATIVE

## 2017-05-06 MED ORDER — ASPIRIN EC 81 MG PO TBEC: 81.0000 mg | DELAYED_RELEASE_TABLET | Freq: Every day | ORAL | 0 refills | Status: DC

## 2017-05-06 MED ORDER — ATORVASTATIN CALCIUM 20 MG PO TABS: 20.0000 mg | ORAL_TABLET | Freq: Every day | ORAL | 3 refills | Status: DC

## 2017-05-06 NOTE — Progress Notes (Signed)
FYI. Getting a ultrasound of his carotids.  Staring ASA and atorvastatin. Philis Fendt, MS, PA-C 4:20 PM, 05/06/2017

## 2017-05-06 NOTE — Progress Notes (Addendum)
05/10/2017 3:48 PM   DOB: May 23, 1961 / MRN: [id number]  SUBJECTIVE:  Stephen Peters is a 56 y.o. male presenting for sore throat.  He does have a history of allergies. Thinks that maybe he throat is reacting to "burined coil" in an e-cig.  Tells me that he also breathed in some dust.  Has several new partners.  Describes the pain as a tightness around his throat.  Gatorade helps. Tells that he will sometimes feel that he can not catch his breath. This happens once every 48 hours and is mostly at night. Feels short of breath due to this. Did lose a friend to similar symptoms. Takes dexilant with excellent relief of symptoms and he has been consistent with medication for many months.      He has No Known Allergies.   He  has a past medical history of Allergy and Cataract.    He  reports that he quit smoking about 4 years ago. His smoking use included Cigarettes. He has a 30.00 pack-year smoking history. He has never used smokeless tobacco. He reports that he drinks alcohol. He reports that he does not use drugs. He  reports that he currently engages in sexual activity. He reports using the following method of birth control/protection: None. The patient  has a past surgical history that includes Eye surgery.  His family history is not on file.  Review of Systems  Constitutional: Negative for chills, diaphoresis and fever.  HENT: Positive for congestion and sore throat. Negative for hearing loss and sinus pain.   Respiratory: Negative for shortness of breath.   Cardiovascular: Negative for chest pain, orthopnea and leg swelling.  Gastrointestinal: Negative for nausea.  Skin: Negative for rash.  Neurological: Negative for dizziness.    The problem list and medications were reviewed and updated by myself where necessary and exist elsewhere in the encounter.   OBJECTIVE:  BP 128/72 (BP Location: Left Arm, Patient Position: Sitting, Cuff Size: Large)   Pulse 83   Temp 98 F (36.7 C) (Oral)    Resp 16   Ht 5\' 10"  (1.778 m)   Wt 165 lb 9.6 oz (75.1 kg)   SpO2 97%   BMI 23.76 kg/m   Physical Exam  Constitutional: He is oriented to person, place, and time. He appears well-developed. He is active and cooperative.  Non-toxic appearance.  Eyes: Pupils are equal, round, and reactive to light. EOM are normal.  Cardiovascular: Normal rate, regular rhythm, S1 normal, S2 normal, normal heart sounds, intact distal pulses and normal pulses.  Exam reveals no gallop and no friction rub.   No murmur heard. Pulmonary/Chest: Effort normal. No stridor. No tachypnea. No respiratory distress. He has no wheezes. He has no rales.  Abdominal: He exhibits no distension.  Musculoskeletal: He exhibits no edema.  Neurological: He is alert and oriented to person, place, and time. He has normal strength and normal reflexes. He is not disoriented. No cranial nerve deficit or sensory deficit. He exhibits normal muscle tone. Coordination and gait normal.  Skin: Skin is warm and dry. He is not diaphoretic. No pallor.     Psychiatric: His behavior is normal.  Vitals reviewed.   No results found for this or any previous visit (from the past 72 hour(s)).  No results found. Procedure: Verbal consent obtained. Sterile prep with betadine. Anesthetized with 2% lido with epi.  Lesion removed via elliptical excision.  Wound repaired with 3 HM throws using 3-0 ethilon suture material.  Patient tolerated  without complaint.   Lab Results  Component Value Date   CHOL 242 (H) 05/04/2014   HDL 57 05/04/2014   LDLCALC 164 (H) 05/04/2014   TRIG 103 05/04/2014   CHOLHDL 4.2 05/04/2014     ASSESSMENT AND PLAN:  Stephen Peters was seen today for sore throat.  Diagnoses and all orders for this visit:  Sore throat: Given problem two and my inability make a hard diagnosis here I think its best he see ent.  He will take an intranasal corticosteroid starting tomorrow until his appointment to see if that makes a difference.  -      GC/Chlamydia Probe Amp -     Allergen Profile, Mold -     Culture, Group A Strep -     POCT rapid strep A -     Epstein-Barr virus VCA antibody panel -     HIV antibody (with reflex) -     CBC with Differential/Platelet -     DG Neck Soft Tissue; Future  Wound healing, delayed: Basal cell with margins involved. Advies his best bet would be dermatology at this point.  -     Dermatology pathology  Carotid artery calcification, bilateral: Incidentaloma on rads.  Will follow with a [place] and plaque stabilization.  -     VAS US CAROTID; Future -     atorvastatin (LIPITOR) 20 MG tablet; Take 1 tablet (20 mg total) by mouth daily. -     aspirin EC 81 MG tablet; Take 1 tablet (81 mg total) by mouth daily.  Other orders -     Cancel: Tdap vaccine greater than or equal to 7yo IM -     Tdap vaccine greater than or equal to 7yo IM    The patient is advised to call or return to clinic if he does not see an improvement in symptoms, or to seek the care of the closest emergency department if he worsens with the above plan.   Philis Fendt, MHS, PA-C [facility] 05/10/2017 3:48 PM

## 2017-05-07 DIAGNOSIS — I6523 Occlusion and stenosis of bilateral carotid arteries: Secondary | ICD-10-CM | POA: Insufficient documentation

## 2017-05-08 LAB — CBC WITH DIFFERENTIAL/PLATELET
BASOS ABS: 0.1 10*3/uL (ref 0.0–0.2)
BASOS: 1 %
EOS (ABSOLUTE): 0.6 10*3/uL — AB (ref 0.0–0.4)
Eos: 10 %
Hematocrit: 45.6 % (ref 37.5–51.0)
Hemoglobin: 16 g/dL (ref 13.0–17.7)
IMMATURE GRANULOCYTES: 0 %
Immature Grans (Abs): 0 10*3/uL (ref 0.0–0.1)
Lymphocytes Absolute: 1.5 10*3/uL (ref 0.7–3.1)
Lymphs: 26 %
MCH: 31.6 pg (ref 26.6–33.0)
MCHC: 35.1 g/dL (ref 31.5–35.7)
MCV: 90 fL (ref 79–97)
MONOS ABS: 0.5 10*3/uL (ref 0.1–0.9)
Monocytes: 8 %
NEUTROS PCT: 55 %
Neutrophils Absolute: 3.3 10*3/uL (ref 1.4–7.0)
PLATELETS: 208 10*3/uL (ref 150–379)
RBC: 5.06 x10E6/uL (ref 4.14–5.80)
RDW: 13.9 % (ref 12.3–15.4)
WBC: 6 10*3/uL (ref 3.4–10.8)

## 2017-05-08 LAB — HIV ANTIBODY (ROUTINE TESTING W REFLEX): HIV SCREEN 4TH GENERATION: NONREACTIVE

## 2017-05-08 LAB — ALLERGEN PROFILE, MOLD
Aureobasidi Pullulans IgE: <0.1 kU/L
M009-IgE Fusarium proliferatum: <0.1 kU/L
Mucor Racemosus IgE: <0.1 kU/L
Penicillium Chrysogen IgE: <0.1 kU/L
Stemphylium Herbarum IgE: <0.1 kU/L

## 2017-05-08 LAB — GC/CHLAMYDIA PROBE AMP
Chlamydia trachomatis, NAA: NEGATIVE
NEISSERIA GONORRHOEAE BY PCR: NEGATIVE

## 2017-05-08 LAB — EPSTEIN-BARR VIRUS VCA ANTIBODY PANEL
EBV Early Antigen Ab, IgG: <9 U/mL (ref 0.0–8.9)
EBV NA IGG: 50.8 U/mL — AB (ref 0.0–17.9)
EBV VCA IgG: 115 U/mL — ABNORMAL HIGH (ref 0.0–17.9)

## 2017-05-08 LAB — CULTURE, GROUP A STREP: Strep A Culture: NEGATIVE

## 2017-05-09 ENCOUNTER — Other Ambulatory Visit: Payer: Self-pay | Admitting: Physician Assistant

## 2017-05-09 DIAGNOSIS — C44519 Basal cell carcinoma of skin of other part of trunk: Secondary | ICD-10-CM

## 2017-05-10 NOTE — Addendum Note (Signed)
Addended by: Tereasa Coop on: 05/10/2017 03:51 PM   Modules accepted: Orders

## 2017-05-10 NOTE — Progress Notes (Signed)
Hi Renay,  Please see this gentlemen's referrals and carotid ultrasound. He has an unexplained sore throat and turns out he had a basal cell carcinoma on his back and I worry that I can't find an explanation for why his throat hurts.  I'd like him into both appointments sooner than later.  The US neck is a different issue altogether. He is awaiting a call from our office.  Thanks for your hard work. Philis Fendt, MS, PA-C 3:54 PM, 05/10/2017

## 2017-05-14 ENCOUNTER — Telehealth: Payer: Self-pay | Admitting: Physician Assistant

## 2017-05-14 NOTE — Telephone Encounter (Signed)
Message sent to Renay to send out pt's referrals and U/S order. I have sent the referrals but did not see an order for an U/S to be sent. Please advise. Thanks!

## 2017-05-15 ENCOUNTER — Encounter: Payer: Self-pay | Admitting: Physician Assistant

## 2017-05-15 ENCOUNTER — Ambulatory Visit (INDEPENDENT_AMBULATORY_CARE_PROVIDER_SITE_OTHER): Payer: BLUE CROSS/BLUE SHIELD | Admitting: Physician Assistant

## 2017-05-15 VITALS — BP 118/72 | HR 80 | Temp 97.5°F | Resp 16 | Ht 71.0 in | Wt 164.8 lb

## 2017-05-15 DIAGNOSIS — Z4802 Encounter for removal of sutures: Secondary | ICD-10-CM

## 2017-05-15 NOTE — Addendum Note (Signed)
Addended by: Tereasa Coop on: 05/15/2017 09:35 AM   Modules accepted: Level of Service

## 2017-05-15 NOTE — Progress Notes (Signed)
  05/15/2017 8:29 AM   DOB: 1961/08/12 / MRN: [id number]  SUBJECTIVE:  Stephen Peters is a 56 y.o. male presenting for a suture removal.  Lesion cut from his back ten days ago and wound did reveal basal cell.  Tells me the wound feels fine today.    There is no immunization history for the selected administration types on file for this patient.  He has No Known Allergies.   Review of Systems  Constitutional: Negative for chills, diaphoresis and fever.  Gastrointestinal: Negative for nausea.  Skin: Negative for rash.  Neurological: Negative for dizziness.    The problem list and medications were reviewed and updated by myself where necessary and exist elsewhere in the encounter.   OBJECTIVE:  BP 118/72 (BP Location: Left Arm, Patient Position: Sitting, Cuff Size: Normal)   Pulse 80   Temp (!) 97.5 F (36.4 C) (Oral)   Resp 16   Ht 5\' 11"  (1.803 m)   Wt 164 lb 12.8 oz (74.8 kg)   SpO2 98%   BMI 22.98 kg/m   Physical Exam  Constitutional: He is active and cooperative.  Cardiovascular: Normal rate, regular rhythm, S1 normal, S2 normal, normal heart sounds, intact distal pulses and normal pulses.  Exam reveals no gallop and no friction rub.   No murmur heard. Pulmonary/Chest: Effort normal. No tachypnea. He has no rales.  Abdominal: He exhibits no distension.  Musculoskeletal: He exhibits no edema.  Neurological: He is alert.  Skin: Skin is warm. No rash noted.  3 sutures removed without difficulty. Wound well apporixmated and without exquisite tenderness. \  Vitals reviewed.     ASSESSMENT AND PLAN:  Inocencio was seen today for suture / staple removal.  Diagnoses and all orders for this visit:  Visit for suture removal: Given new basal cell carcinoma I did remove some of the lesion.  He will follow derm to remove the remainder in the most efficient fashion.     The patient is advised to call or return to clinic if he does not see an improvement in symptoms, or to seek the  care of the closest emergency department if he worsens with the above plan.   Philis Fendt, MHS, PA-C [facility] 05/15/2017 8:29 AM

## 2017-05-15 NOTE — Patient Instructions (Signed)
     IF you received an x-ray today, you will receive an invoice from [facility]. Please contact [facility] at [phone] with questions or concerns regarding your invoice.   IF you received labwork today, you will receive an invoice from [facility]. Please contact [facility] at [phone] with questions or concerns regarding your invoice.   Our billing staff will not be able to assist you with questions regarding bills from these companies.  You will be contacted with the lab results as soon as they are available. The fastest way to get your results is to activate your My Chart account. Instructions are located on the last page of this paperwork. If you have not heard from us regarding the results in 2 weeks, please contact this office.     

## 2017-05-17 DIAGNOSIS — R1313 Dysphagia, pharyngeal phase: Secondary | ICD-10-CM | POA: Diagnosis not present

## 2017-05-17 DIAGNOSIS — J3501 Chronic tonsillitis: Secondary | ICD-10-CM | POA: Diagnosis not present

## 2017-05-21 ENCOUNTER — Ambulatory Visit (HOSPITAL_COMMUNITY)
Admission: RE | Admit: 2017-05-21 | Discharge: 2017-05-21 | Disposition: A | Discharge status: Home / Self Care | Payer: BLUE CROSS/BLUE SHIELD | Source: Ambulatory Visit | Attending: Physician Assistant | Admitting: Physician Assistant

## 2017-05-21 DIAGNOSIS — I6523 Occlusion and stenosis of bilateral carotid arteries: Secondary | ICD-10-CM | POA: Insufficient documentation

## 2017-05-21 LAB — VAS US CAROTID
LCCADDIAS: -15 cm/s
LEFT ECA DIAS: -12 cm/s
LEFT VERTEBRAL DIAS: -12 cm/s
LICADDIAS: -26 cm/s
LICADSYS: -82 cm/s
LICAPDIAS: 10 cm/s
LICAPSYS: 70 cm/s
Left CCA dist sys: -92 cm/s
Left CCA prox dias: 17 cm/s
Left CCA prox sys: 132 cm/s
RIGHT ECA DIAS: -13 cm/s
RIGHT VERTEBRAL DIAS: 11 cm/s
Right CCA prox dias: 19 cm/s
Right CCA prox sys: 103 cm/s
Right cca dist sys: -79 cm/s

## 2017-05-21 NOTE — Progress Notes (Signed)
**  Preliminary report by tech**  Carotid artery duplex complete. Findings are consistent with a 1-39 percent stenosis involving the right internal carotid artery and the left internal carotid artery. The vertebral arteries demonstrate antegrade flow.  05/21/17 9:31 AM Stephen Peters RVT

## 2017-06-10 ENCOUNTER — Encounter: Payer: Self-pay | Admitting: Physician Assistant

## 2017-06-10 ENCOUNTER — Ambulatory Visit (INDEPENDENT_AMBULATORY_CARE_PROVIDER_SITE_OTHER): Payer: BLUE CROSS/BLUE SHIELD | Admitting: Physician Assistant

## 2017-06-10 VITALS — HR 85 | Temp 97.9°F | Resp 16 | Ht 71.0 in | Wt 166.4 lb

## 2017-06-10 DIAGNOSIS — C44519 Basal cell carcinoma of skin of other part of trunk: Secondary | ICD-10-CM | POA: Diagnosis not present

## 2017-06-10 NOTE — Progress Notes (Signed)
    06/10/2017 10:58 AM   DOB: 02-20-1961 / MRN: [id number]  SUBJECTIVE:  Stephen Peters is a 56 y.o. male presenting for basal cell carcinoma.  I had removed part of the lesion about 1 month ago and path reports showed margins remained.  I referred him to derm. He says it would be a two months wait and over 1000 dollars.  He is very worried about the lesion and would like me to remove more tissue today if possible. He will keep his derm appointment if he has too.   He has No Known Allergies.   He  has a past medical history of Allergy and Cataract.    He  reports that he quit smoking about 4 years ago. His smoking use included Cigarettes. He has a 30.00 pack-year smoking history. He has never used smokeless tobacco. He reports that he drinks alcohol. He reports that he does not use drugs. He  reports that he currently engages in sexual activity. He reports using the following method of birth control/protection: None. The patient  has a past surgical history that includes Eye surgery.  His family history is not on file.  Review of Systems  Constitutional: Negative for chills, diaphoresis and fever.  Respiratory: Negative for shortness of breath.   Cardiovascular: Negative for chest pain, orthopnea and leg swelling.  Gastrointestinal: Negative for nausea.  Skin: Negative for rash.  Neurological: Negative for dizziness.    The problem list and medications were reviewed and updated by myself where necessary and exist elsewhere in the encounter.   OBJECTIVE:  Pulse 85   Temp 97.9 F (36.6 C) (Oral)   Resp 16   Ht 5\' 11"  (1.803 m)   Wt 166 lb 6.4 oz (75.5 kg)   SpO2 100%   BMI 23.21 kg/m   Physical Exam  Constitutional: He is oriented to person, place, and time.  Cardiovascular: Normal rate and regular rhythm.   Pulmonary/Chest: Effort normal and breath sounds normal.  Musculoskeletal: Normal range of motion.  Neurological: He is alert and oriented to person, place, and time.    Skin:      Procedure: Risk and benefits discussed and verbal consent obtained. Betadine prep times three. Sterile drape placed. Patient anesthetized with 2% lidocaine with epi. Lesion of concern excised in an elliptical fashion. Wound repaired with a mixture of vertical mattress 3-0 prolene and several superficial throws for support near the area of highest tension in a simple continuous fashion.  Wound well approximated. Mupirocin dressing placed.   No results found for this or any previous visit (from the past 72 hour(s)).  No results found.  ASSESSMENT AND PLAN:  Stephen Peters was seen today for skin lesion.  Diagnoses and all orders for this visit:  Basal cell carcinoma (BCC) of skin of other part of torso: Excised and repaired per procedure note.  He will come back and see PA English on Thursday or Friday for a wound check.     The patient is advised to call or return to clinic if he does not see an improvement in symptoms, or to seek the care of the closest emergency department if he worsens with the above plan.   Philis Fendt, MHS, PA-C [facility] 06/10/2017 10:58 AM

## 2017-06-10 NOTE — Patient Instructions (Addendum)
Take 1000 mg of tylenol every 8 hours for any wound discomfort. 200-400 mg of iburpofen every 12 is okay if tylenol is not working.   WOUND CARE Please return in about 3 days to have your wound checked.  Marland Kitchen Keep area clean and dry for 24 hours. Do not remove bandage, if applied. . After 24 hours, remove bandage and wash wound gently with mild soap and warm water. Reapply a new bandage after cleaning wound, if directed. . Continue daily cleansing with soap and water until stitches/staples are removed. . Do not apply any ointments or creams to the wound while stitches/staples are in place, as this may cause delayed healing. . Notify the office if you experience any of the following signs of infection: Swelling, redness, pus drainage, streaking, fever >101.0 F . Notify the office if you experience excessive bleeding that does not stop after 15-20 minutes of constant, firm pressure.     IF you received an x-ray today, you will receive an invoice from [facility]. Please contact [facility] at [phone] with questions or concerns regarding your invoice.   IF you received labwork today, you will receive an invoice from [place]. Please contact [facility] at [phone] with questions or concerns regarding your invoice.   Our billing staff will not be able to assist you with questions regarding bills from these companies.  You will be contacted with the lab results as soon as they are available. The fastest way to get your results is to activate your My Chart account. Instructions are located on the last page of this paperwork. If you have not heard from [place] regarding the results in 2 weeks, please contact this office.

## 2017-06-14 ENCOUNTER — Encounter: Payer: Self-pay | Admitting: Physician Assistant

## 2017-06-14 ENCOUNTER — Ambulatory Visit (INDEPENDENT_AMBULATORY_CARE_PROVIDER_SITE_OTHER): Payer: BLUE CROSS/BLUE SHIELD | Admitting: Physician Assistant

## 2017-06-14 VITALS — BP 138/72 | HR 90 | Temp 98.3°F | Resp 16 | Ht 71.0 in | Wt 166.2 lb

## 2017-06-14 DIAGNOSIS — T148XXA Other injury of unspecified body region, initial encounter: Secondary | ICD-10-CM

## 2017-06-14 DIAGNOSIS — C44519 Basal cell carcinoma of skin of other part of trunk: Secondary | ICD-10-CM

## 2017-06-14 DIAGNOSIS — Z5189 Encounter for other specified aftercare: Secondary | ICD-10-CM

## 2017-06-14 NOTE — Progress Notes (Signed)
PRIMARY CARE AT [facility] [address], [place] [address] [phone]  Date:  06/14/2017   Name:  Stephen Peters   DOB:  03/25/1961   MRN:  [id number]  PCP:  Tereasa Coop, PA-C    History of Present Illness:  Stephen Peters is a 56 y.o. male patient who presents to PCP with  Chief Complaint  Patient presents with  . Follow-up     Patient is here for follow up of wound care.  He was seen more than 1 month ago for lesion. Biopsy revealed basal cell carcinoma, with margins involved.  He then had these margins excised and sent to pathology, who have reviewed as free from basal cell carcinoma.  Today he returns for wound check.  He has no present concerns.  Keeps bandages changed by son.  No bleeding or fluid from the wound.    Patient Active Problem List   Diagnosis Date Noted  . Carotid artery calcification, bilateral 05/07/2017  . Anxiety 10/18/2015  . Insomnia 10/18/2015  . Hypercholesteremia 06/08/2014  . Nonspecific abnormal electrocardiogram (ECG) (EKG) 06/08/2014  . Gastro-esophageal reflux 04/14/2014  . Environmental allergies 04/14/2014    Past Medical History:  Diagnosis Date  . Allergy   . Cataract     Past Surgical History:  Procedure Laterality Date  . EYE SURGERY      Social History  Substance Use Topics  . Smoking status: Former Smoker    Packs/day: 1.00    Years: 30.00    Types: Cigarettes    Quit date: 10/12/2012  . Smokeless tobacco: Never Used  . Alcohol use 0.0 oz/week     Comment: 10 drinks    History reviewed. No pertinent family history.  No Known Allergies  Medication list has been reviewed and updated.  Current Outpatient Prescriptions on File Prior to Visit  Medication Sig Dispense Refill  . ALPRAZolam (XANAX) 0.5 MG tablet Take 1 tablet (0.5 mg total) by mouth at bedtime as needed for anxiety. 30 tablet 5  . aspirin EC 81 MG tablet Take 1 tablet (81 mg total) by mouth daily. 90 tablet 0  . atorvastatin (LIPITOR) 20 MG tablet Take 1  tablet (20 mg total) by mouth daily. 30 tablet 3  . Dexlansoprazole (DEXILANT) 30 MG capsule TAKE ONE CAPSULE BY MOUTH EVERY DAY 90 capsule 3  . escitalopram (LEXAPRO) 5 MG tablet Take 1 tablet (5 mg total) by mouth daily. 90 tablet 3  . Polyethyl Glycol-Propyl Glycol (SYSTANE) 0.4-0.3 % GEL ophthalmic gel Place 1 application into both eyes daily. 10 mL 0  . sildenafil (REVATIO) 20 MG tablet Take 1-3 tablets (20-60 mg total) by mouth daily as needed. 12 tablet 6   No current facility-administered medications on file prior to visit.     ROS ROS otherwise unremarkable unless listed above.  Physical Examination: There were no vitals taken for this visit. Ideal Body Weight:    Physical Exam  Constitutional: He is oriented to person, place, and time. He appears well-developed and well-nourished. No distress.  HENT:  Head: Normocephalic and atraumatic.  Eyes: Conjunctivae and EOM are normal. Pupils are equal, round, and reactive to light.  Cardiovascular: Normal rate.  Pulmonary/Chest: Effort normal. No respiratory distress.  Neurological: He is alert and oriented to person, place, and time.  Skin: Skin is warm and dry. He is not diaphoretic.  Right region of back with intact sutures.  No dehiscence detected.  No erythema or purulent drainage.  Psychiatric: He has a normal  mood and affect. His behavior is normal.     Assessment and Plan: Stephen Peters is a 56 y.o. male who is here today for cc of  Chief Complaint  Patient presents with  . Follow-up    basal cell carcinoma getting better   rtc in 5 days for suture removal.  Symptoms to warrant an immediate return discussed. Basal cell carcinoma (BCC) of skin of other part of torso  Encounter for wound care  Suture of skin wound  Ivar Drape, PA-C [facility] 11/4/20183:38 PM

## 2017-06-14 NOTE — Patient Instructions (Signed)
     IF you received an x-ray today, you will receive an invoice from [facility]. Please contact [facility] at [phone] with questions or concerns regarding your invoice.   IF you received labwork today, you will receive an invoice from [facility]. Please contact [facility] at [phone] with questions or concerns regarding your invoice.   Our billing staff will not be able to assist you with questions regarding bills from these companies.  You will be contacted with the lab results as soon as they are available. The fastest way to get your results is to activate your My Chart account. Instructions are located on the last page of this paperwork. If you have not heard from us regarding the results in 2 weeks, please contact this office.     

## 2017-06-17 ENCOUNTER — Ambulatory Visit (INDEPENDENT_AMBULATORY_CARE_PROVIDER_SITE_OTHER): Payer: BLUE CROSS/BLUE SHIELD | Admitting: Physician Assistant

## 2017-06-17 VITALS — BP 147/86 | HR 72 | Temp 98.6°F | Resp 16 | Ht 71.0 in | Wt 166.0 lb

## 2017-06-17 DIAGNOSIS — T148XXA Other injury of unspecified body region, initial encounter: Secondary | ICD-10-CM

## 2017-06-17 DIAGNOSIS — Z4802 Encounter for removal of sutures: Secondary | ICD-10-CM

## 2017-06-17 NOTE — Patient Instructions (Signed)
     IF you received an x-ray today, you will receive an invoice from [facility]. Please contact [facility] at [phone] with questions or concerns regarding your invoice.   IF you received labwork today, you will receive an invoice from [facility]. Please contact [facility] at [phone] with questions or concerns regarding your invoice.   Our billing staff will not be able to assist you with questions regarding bills from these companies.  You will be contacted with the lab results as soon as they are available. The fastest way to get your results is to activate your My Chart account. Instructions are located on the last page of this paperwork. If you have not heard from us regarding the results in 2 weeks, please contact this office.     

## 2017-06-19 ENCOUNTER — Ambulatory Visit: Payer: BLUE CROSS/BLUE SHIELD | Admitting: Physician Assistant

## 2017-06-19 ENCOUNTER — Encounter: Payer: Self-pay | Admitting: Physician Assistant

## 2017-06-19 NOTE — Progress Notes (Signed)
PRIMARY CARE AT [facility] [address], [place] [address] [phone]  Date:  06/17/2017   Name:  Azarion Hove   DOB:  November 02, 1960   MRN:  [id number]  PCP:  Tereasa Coop, PA-C    History of Present Illness:  Jeramey Lanuza is a 56 y.o. male patient who presents to PCP with  Chief Complaint  Patient presents with  . Suture / Staple Removal    wound care/ back     Patient is here for wound check and possible suture removal.  This was an excision of a basal cell carcinoma on back.  He notes no complaints with the wound.  No drainage.  No fever.  Keeps a bandage over this  Patient Active Problem List   Diagnosis Date Noted  . Carotid artery calcification, bilateral 05/07/2017  . Anxiety 10/18/2015  . Insomnia 10/18/2015  . Hypercholesteremia 06/08/2014  . Nonspecific abnormal electrocardiogram (ECG) (EKG) 06/08/2014  . Gastro-esophageal reflux 04/14/2014  . Environmental allergies 04/14/2014    Past Medical History:  Diagnosis Date  . Allergy   . Cataract     Past Surgical History:  Procedure Laterality Date  . EYE SURGERY      Social History   Tobacco Use  . Smoking status: Former Smoker    Packs/day: 1.00    Years: 30.00    Pack years: 30.00    Types: Cigarettes    Last attempt to quit: 10/12/2012    Years since quitting: 4.6  . Smokeless tobacco: Never Used  Substance Use Topics  . Alcohol use: Yes    Alcohol/week: 0.0 oz    Comment: 10 drinks  . Drug use: No    No family history on file.  No Known Allergies  Medication list has been reviewed and updated.  Current Outpatient Medications on File Prior to Visit  Medication Sig Dispense Refill  . ALPRAZolam (XANAX) 0.5 MG tablet Take 1 tablet (0.5 mg total) by mouth at bedtime as needed for anxiety. 30 tablet 5  . aspirin EC 81 MG tablet Take 1 tablet (81 mg total) by mouth daily. 90 tablet 0  . atorvastatin (LIPITOR) 20 MG tablet Take 1 tablet (20 mg total) by mouth daily. 30 tablet 3  .  Dexlansoprazole (DEXILANT) 30 MG capsule TAKE ONE CAPSULE BY MOUTH EVERY DAY 90 capsule 3  . sildenafil (REVATIO) 20 MG tablet Take 1-3 tablets (20-60 mg total) by mouth daily as needed. 12 tablet 6   No current facility-administered medications on file prior to visit.     ROS ROS otherwise unremarkable unless listed above.  Physical Examination: BP (!) 147/86   Pulse 72   Temp 98.6 F (37 C) (Oral)   Resp 16   Ht 5\' 11"  (1.803 m)   Wt 166 lb (75.3 kg)   SpO2 98%   BMI 23.15 kg/m  Ideal Body Weight: Weight in (lb) to have BMI = 25: 178.9  Physical Exam  Constitutional: He is oriented to person, place, and time. He appears well-developed and well-nourished. No distress.  HENT:  Head: Normocephalic and atraumatic.  Eyes: Conjunctivae and EOM are normal. Pupils are equal, round, and reactive to light.  Cardiovascular: Normal rate.  Pulmonary/Chest: Effort normal. No respiratory distress.  Neurological: He is alert and oriented to person, place, and time.  Skin: Skin is warm and dry. He is not diaphoretic.  Sutures intact.  No erythema or purulence.  Psychiatric: He has a normal mood and affect. His behavior is normal.  Assessment and Plan: Crist Kruszka is a 56 y.o. male who is here today for cc of  Chief Complaint  Patient presents with  . Suture / Staple Removal    wound care/ back  sutures removed without complication or look of dehiscence potential Rtc as needed.  Visit for suture removal  Suture of skin wound  Ivar Drape, PA-C [facility] 11/7/20187:54 AM

## 2017-07-18 DIAGNOSIS — M9901 Segmental and somatic dysfunction of cervical region: Secondary | ICD-10-CM | POA: Diagnosis not present

## 2017-07-18 DIAGNOSIS — M545 Low back pain: Secondary | ICD-10-CM | POA: Diagnosis not present

## 2017-07-18 DIAGNOSIS — M9903 Segmental and somatic dysfunction of lumbar region: Secondary | ICD-10-CM | POA: Diagnosis not present

## 2017-07-18 DIAGNOSIS — M9902 Segmental and somatic dysfunction of thoracic region: Secondary | ICD-10-CM | POA: Diagnosis not present

## 2017-08-11 ENCOUNTER — Other Ambulatory Visit: Payer: Self-pay | Admitting: Physician Assistant

## 2017-08-11 DIAGNOSIS — I6523 Occlusion and stenosis of bilateral carotid arteries: Secondary | ICD-10-CM

## 2017-08-26 DIAGNOSIS — M9903 Segmental and somatic dysfunction of lumbar region: Secondary | ICD-10-CM | POA: Diagnosis not present

## 2017-08-26 DIAGNOSIS — M545 Low back pain: Secondary | ICD-10-CM | POA: Diagnosis not present

## 2017-08-26 DIAGNOSIS — M9902 Segmental and somatic dysfunction of thoracic region: Secondary | ICD-10-CM | POA: Diagnosis not present

## 2017-08-26 DIAGNOSIS — M9901 Segmental and somatic dysfunction of cervical region: Secondary | ICD-10-CM | POA: Diagnosis not present

## 2017-09-05 ENCOUNTER — Other Ambulatory Visit: Payer: Self-pay | Admitting: Physician Assistant

## 2017-09-05 DIAGNOSIS — F411 Generalized anxiety disorder: Secondary | ICD-10-CM

## 2017-09-05 NOTE — Telephone Encounter (Signed)
Controlled substance 

## 2017-09-05 NOTE — Telephone Encounter (Signed)
Pt last saw Colletta Maryland on 11/2, 06/17/2017 no follow up scheduled Pls advise

## 2017-09-05 NOTE — Telephone Encounter (Signed)
Copied from [place] [id number]. Topic: Quick Communication - See Telephone Encounter >> Sep 05, 2017  8:27 AM Ether Griffins B wrote: CRM for notification. See Telephone encounter for:  Pt needing refill on xanax he is completely out and didn't realize he didn't have any refills left. And he needs this to help to sleep.  09/05/17.

## 2017-09-10 ENCOUNTER — Other Ambulatory Visit: Payer: Self-pay | Admitting: Physician Assistant

## 2017-09-10 DIAGNOSIS — I6523 Occlusion and stenosis of bilateral carotid arteries: Secondary | ICD-10-CM

## 2017-10-11 ENCOUNTER — Other Ambulatory Visit: Payer: Self-pay | Admitting: Physician Assistant

## 2017-10-11 DIAGNOSIS — I6523 Occlusion and stenosis of bilateral carotid arteries: Secondary | ICD-10-CM

## 2017-10-15 DIAGNOSIS — M545 Low back pain: Secondary | ICD-10-CM | POA: Diagnosis not present

## 2017-10-15 DIAGNOSIS — M9902 Segmental and somatic dysfunction of thoracic region: Secondary | ICD-10-CM | POA: Diagnosis not present

## 2017-10-15 DIAGNOSIS — M9903 Segmental and somatic dysfunction of lumbar region: Secondary | ICD-10-CM | POA: Diagnosis not present

## 2017-10-15 DIAGNOSIS — M9901 Segmental and somatic dysfunction of cervical region: Secondary | ICD-10-CM | POA: Diagnosis not present

## 2017-10-21 ENCOUNTER — Other Ambulatory Visit: Payer: Self-pay | Admitting: Optometry

## 2017-10-21 DIAGNOSIS — L820 Inflamed seborrheic keratosis: Secondary | ICD-10-CM | POA: Diagnosis not present

## 2017-11-06 ENCOUNTER — Other Ambulatory Visit: Payer: Self-pay | Admitting: Physician Assistant

## 2017-11-06 DIAGNOSIS — I6523 Occlusion and stenosis of bilateral carotid arteries: Secondary | ICD-10-CM

## 2017-11-08 ENCOUNTER — Telehealth: Payer: Self-pay | Admitting: Physician Assistant

## 2017-11-08 DIAGNOSIS — I6523 Occlusion and stenosis of bilateral carotid arteries: Secondary | ICD-10-CM

## 2017-11-13 ENCOUNTER — Encounter: Payer: Self-pay | Admitting: Physician Assistant

## 2017-11-13 NOTE — Telephone Encounter (Signed)
Incoming fax from pharmacy received requesting 90 day supply for atorvastatin 20mg .  Phone call to pharmacy, spoke with Clair Gulling, pharmacist. Relayed 90 day supply request denied, patient needs office visit. He verbalizes understanding.   Please schedule patient for office visit - he is overdue for office visit to check cholesterol and follow up on medication.

## 2017-11-27 ENCOUNTER — Other Ambulatory Visit: Payer: Self-pay | Admitting: Urgent Care

## 2017-11-27 DIAGNOSIS — M9903 Segmental and somatic dysfunction of lumbar region: Secondary | ICD-10-CM | POA: Diagnosis not present

## 2017-11-27 DIAGNOSIS — R142 Eructation: Secondary | ICD-10-CM

## 2017-11-27 DIAGNOSIS — M9902 Segmental and somatic dysfunction of thoracic region: Secondary | ICD-10-CM | POA: Diagnosis not present

## 2017-11-27 DIAGNOSIS — K219 Gastro-esophageal reflux disease without esophagitis: Secondary | ICD-10-CM

## 2017-11-27 DIAGNOSIS — M9901 Segmental and somatic dysfunction of cervical region: Secondary | ICD-10-CM | POA: Diagnosis not present

## 2017-11-27 DIAGNOSIS — M545 Low back pain: Secondary | ICD-10-CM | POA: Diagnosis not present

## 2017-11-27 NOTE — Telephone Encounter (Signed)
Dexilant DR 30 mg capsule  LOV 10/02/16 with Jaynee Eagles  [facility] [address] [place].

## 2017-11-28 NOTE — Telephone Encounter (Signed)
Called pt - made an appt with Carlis Abbott for Saturday 11/30/17

## 2017-11-30 ENCOUNTER — Ambulatory Visit: Payer: BLUE CROSS/BLUE SHIELD | Admitting: Physician Assistant

## 2017-11-30 ENCOUNTER — Encounter: Payer: Self-pay | Admitting: Physician Assistant

## 2017-11-30 ENCOUNTER — Other Ambulatory Visit: Payer: Self-pay

## 2017-11-30 VITALS — BP 122/60 | HR 97 | Temp 98.1°F | Resp 16 | Ht 70.0 in | Wt 166.0 lb

## 2017-11-30 DIAGNOSIS — K219 Gastro-esophageal reflux disease without esophagitis: Secondary | ICD-10-CM | POA: Diagnosis not present

## 2017-11-30 DIAGNOSIS — I6523 Occlusion and stenosis of bilateral carotid arteries: Secondary | ICD-10-CM | POA: Diagnosis not present

## 2017-11-30 DIAGNOSIS — Z113 Encounter for screening for infections with a predominantly sexual mode of transmission: Secondary | ICD-10-CM

## 2017-11-30 DIAGNOSIS — F411 Generalized anxiety disorder: Secondary | ICD-10-CM

## 2017-11-30 DIAGNOSIS — R142 Eructation: Secondary | ICD-10-CM

## 2017-11-30 MED ORDER — ASPIRIN 81 MG PO TBEC: 81.0000 mg | DELAYED_RELEASE_TABLET | Freq: Every day | ORAL | 3 refills | Status: AC

## 2017-11-30 MED ORDER — ALPRAZOLAM 0.5 MG PO TABS: 0.5000 mg | ORAL_TABLET | Freq: Every evening | ORAL | 0 refills | Status: DC | PRN

## 2017-11-30 MED ORDER — SILDENAFIL CITRATE 20 MG PO TABS: 20.0000 mg | ORAL_TABLET | Freq: Every day | ORAL | 6 refills | Status: AC | PRN

## 2017-11-30 MED ORDER — DEXLANSOPRAZOLE 30 MG PO CPDR: DELAYED_RELEASE_CAPSULE | ORAL | 3 refills | Status: AC

## 2017-11-30 MED ORDER — ATORVASTATIN CALCIUM 20 MG PO TABS: 20.0000 mg | ORAL_TABLET | Freq: Every day | ORAL | 3 refills | Status: AC

## 2017-11-30 NOTE — Patient Instructions (Addendum)
FOLLOW UP IN 6 MONTHS MEDICATION CHECK   IF you received an x-ray today, you will receive an invoice from [facility]. Please contact [facility] at [phone] with questions or concerns regarding your invoice.   IF you received labwork today, you will receive an invoice from [place]. Please contact [facility] at [phone] with questions or concerns regarding your invoice.   Our billing staff will not be able to assist you with questions regarding bills from these companies.  You will be contacted with the lab results as soon as they are available. The fastest way to get your results is to activate your My Chart account. Instructions are located on the last page of this paperwork. If you have not heard from [place] regarding the results in 2 weeks, please contact this office.

## 2017-11-30 NOTE — Progress Notes (Signed)
12/02/2017 1:43 PM   DOB: Oct 21, 1960 / MRN: [id number]  SUBJECTIVE:  Stephen Peters is a 57 y.o. male presenting for med check.  Patient requiring less Xanax as he has found some other means for controlling anxiousness at night.  Feels he is using this less and less but feels reassured having the prescription.  Patient with a history of minimal stenosis of the bilateral carotid arteries.  He is taking aspirin 81 mg along with low-dose statin.  He remains nondiabetic.  He does not miss medication.  Has a long history of GERD well-controlled on Dexilant.  Requesting refills of this today.  Is requiring less Revatio at this time as he has not been quite sexually active as previous.  He has not smoking.   Current Outpatient Medications:  .  ALPRAZolam (XANAX) 0.5 MG tablet, Take 1 tablet (0.5 mg total) by mouth at bedtime as needed for anxiety. Call [phone] for refills roughly 2 weeks before refill due., Disp: 90 tablet, Rfl: 0 .  aspirin 81 MG EC tablet, Take 1 tablet (81 mg total) by mouth daily. Swallow whole., Disp: 90 tablet, Rfl: 3 .  atorvastatin (LIPITOR) 20 MG tablet, Take 1 tablet (20 mg total) by mouth daily., Disp: 90 tablet, Rfl: 3 .  Dexlansoprazole (DEXILANT) 30 MG capsule, TAKE ONE CAPSULE BY MOUTH EVERY DAY, Disp: 90 capsule, Rfl: 3 .  sildenafil (REVATIO) 20 MG tablet, Take 1-3 tablets (20-60 mg total) by mouth daily as needed., Disp: 12 tablet, Rfl: 6   He has No Known Allergies.   He  has a past medical history of Allergy and Cataract.    He  reports that he quit smoking about 5 years ago. His smoking use included cigarettes. He has a 30.00 pack-year smoking history. He has never used smokeless tobacco. He reports that he drinks alcohol. He reports that he does not use drugs. He  reports that he currently engages in sexual activity. He reports using the following method of birth control/protection: None. The patient  has a past surgical history that includes Eye surgery.   His family history is not on file.  Review of Systems  Constitutional: Negative for chills, diaphoresis and fever.  Eyes: Negative.   Respiratory: Negative for cough, hemoptysis, sputum production, shortness of breath and wheezing.   Cardiovascular: Negative for chest pain, orthopnea and leg swelling.  Gastrointestinal: Negative for abdominal pain, blood in stool, constipation, diarrhea, heartburn, melena, nausea and vomiting.  Genitourinary: Negative for dysuria, flank pain, frequency, hematuria and urgency.  Skin: Negative for rash.  Neurological: Negative for dizziness, sensory change, speech change, focal weakness and headaches.    The problem list and medications were reviewed and updated by myself where necessary and exist elsewhere in the encounter.   OBJECTIVE:  BP 122/60 (BP Location: Right Arm)   Pulse 97   Temp 98.1 F (36.7 C) (Oral)   Resp 16   Ht 5\' 10"  (1.778 m)   Wt 166 lb (75.3 kg)   SpO2 97%   BMI 23.82 kg/m   Wt Readings from Last 3 Encounters:  11/30/17 166 lb (75.3 kg)  06/17/17 166 lb (75.3 kg)  06/14/17 166 lb 3.2 oz (75.4 kg)   Temp Readings from Last 3 Encounters:  11/30/17 98.1 F (36.7 C) (Oral)  06/17/17 98.6 F (37 C) (Oral)  06/14/17 98.3 F (36.8 C)   BP Readings from Last 3 Encounters:  11/30/17 122/60  06/17/17 (!) 147/86  06/14/17 138/72   Pulse Readings  from Last 3 Encounters:  11/30/17 97  06/17/17 72  06/14/17 90     Physical Exam  Constitutional: He is oriented to person, place, and time. He appears well-developed. He does not appear ill.  Eyes: Pupils are equal, round, and reactive to light. Conjunctivae and EOM are normal.  Cardiovascular: Normal rate, regular rhythm, S1 normal, S2 normal, normal heart sounds, intact distal pulses and normal pulses. Exam reveals no gallop and no friction rub.  No murmur heard. Pulmonary/Chest: Effort normal. No stridor. No respiratory distress. He has no wheezes. He has no rales.    Abdominal: He exhibits no distension.  Musculoskeletal: Normal range of motion. He exhibits no edema.  Neurological: He is alert and oriented to person, place, and time. No cranial nerve deficit. Coordination normal.  Skin: Skin is warm and dry. He is not diaphoretic.  Psychiatric: He has a normal mood and affect.  Nursing note and vitals reviewed.     Results for orders placed or performed in visit on 11/30/17 (from the past 72 hour(s))  Lipid Panel     Status: None   Collection Time: 11/30/17 12:11 PM  Result Value Ref Range   Cholesterol, Total 172 100 - 199 mg/dL   Triglycerides 82 0 - 149 mg/dL   HDL 66 >39 mg/dL   VLDL Cholesterol Cal 16 5 - 40 mg/dL   LDL Calculated 90 0 - 99 mg/dL   Chol/HDL Ratio 2.6 0.0 - 5.0 ratio    Comment:                                   T. Chol/HDL Ratio                                             Men  Women                               1/2 Avg.Risk  3.4    3.3                                   Avg.Risk  5.0    4.4                                2X Avg.Risk  9.6    7.1                                3X Avg.Risk 23.4   11.0   Hemoglobin A1c     Status: None   Collection Time: 11/30/17 12:11 PM  Result Value Ref Range   Hgb A1c MFr Bld 5.3 4.8 - 5.6 %    Comment:          Prediabetes: 5.7 - 6.4          Diabetes: >6.4          Glycemic control for adults with diabetes: <7.0    Est. average glucose Bld gHb Est-mCnc 105 mg/dL  Renal Function Panel     Status: Abnormal   Collection Time: 11/30/17 12:11 PM  Result Value Ref Range   Glucose 118 (H) 65 - 99 mg/dL   BUN 15 6 - 24 mg/dL   Creatinine, Ser 0.88 0.76 - 1.27 mg/dL   GFR calc non Af Amer 95 >59 mL/min/1.73   GFR calc Af Amer 109 >59 mL/min/1.73   BUN/Creatinine Ratio 17 9 - 20   Sodium 140 134 - 144 mmol/L   Potassium 4.7 3.5 - 5.2 mmol/L   Chloride 100 96 - 106 mmol/L   CO2 26 20 - 29 mmol/L   Calcium 9.6 8.7 - 10.2 mg/dL   Phosphorus 3.3 2.5 - 4.5 mg/dL   Albumin 4.8 3.5 -  5.5 g/dL  CBC     Status: None   Collection Time: 11/30/17 12:11 PM  Result Value Ref Range   WBC 5.5 3.4 - 10.8 x10E3/uL   RBC 5.26 4.14 - 5.80 x10E6/uL   Hemoglobin 16.5 13.0 - 17.7 g/dL   Hematocrit 48.1 37.5 - 51.0 %   MCV 91 79 - 97 fL   MCH 31.4 26.6 - 33.0 pg   MCHC 34.3 31.5 - 35.7 g/dL   RDW 13.8 12.3 - 15.4 %   Platelets 211 150 - 379 x10E3/uL  Hepatic Function Panel     Status: None   Collection Time: 11/30/17 12:11 PM  Result Value Ref Range   Total Protein 7.4 6.0 - 8.5 g/dL   Bilirubin Total 0.6 0.0 - 1.2 mg/dL   Bilirubin, Direct 0.20 0.00 - 0.40 mg/dL   Alkaline Phosphatase 66 39 - 117 IU/L   AST 30 0 - 40 IU/L   ALT 32 0 - 44 IU/L    No results found.  ASSESSMENT AND PLAN:  Stavros was seen today for medication refill.  Diagnoses and all orders for this visit:  Routine screening for STI (sexually transmitted infection) -     GC/Chlamydia Probe Amp -     Trichomonas vaginalis, RNA  GAD (generalized anxiety disorder) -     ALPRAZolam (XANAX) 0.5 MG tablet; Take 1 tablet (0.5 mg total) by mouth at bedtime as needed for anxiety. Call [phone] for refills roughly 2 weeks before refill due.  Carotid artery calcification, bilateral -     atorvastatin (LIPITOR) 20 MG tablet; Take 1 tablet (20 mg total) by mouth daily. -     aspirin 81 MG EC tablet; Take 1 tablet (81 mg total) by mouth daily. Swallow whole. -     Lipid Panel -     Hemoglobin A1c -     Hepatic Function Panel  Gastroesophageal reflux disease, esophagitis presence not specified -     Dexlansoprazole (DEXILANT) 30 MG capsule; TAKE ONE CAPSULE BY MOUTH EVERY DAY -     Renal Function Panel -     CBC  Burping -     Dexlansoprazole (DEXILANT) 30 MG capsule; TAKE ONE CAPSULE BY MOUTH EVERY DAY  Other orders -     sildenafil (REVATIO) 20 MG tablet; Take 1-3 tablets (20-60 mg total) by mouth daily as needed.    The patient is advised to call or return to clinic if he does not see an  improvement in symptoms, or to seek the care of the closest emergency department if he worsens with the above plan.   Philis Fendt, MHS, PA-C [facility] 12/02/2017 1:43 PM

## 2017-12-02 LAB — LIPID PANEL
CHOL/HDL RATIO: 2.6 ratio (ref 0.0–5.0)
Cholesterol, Total: 172 mg/dL (ref 100–199)
HDL: 66 mg/dL (ref >39)
LDL CALC: 90 mg/dL (ref 0–99)
Triglycerides: 82 mg/dL (ref 0–149)
VLDL Cholesterol Cal: 16 mg/dL (ref 5–40)

## 2017-12-02 LAB — HEPATIC FUNCTION PANEL
ALK PHOS: 66 IU/L (ref 39–117)
ALT: 32 IU/L (ref 0–44)
AST: 30 IU/L (ref 0–40)
Bilirubin Total: 0.6 mg/dL (ref 0.0–1.2)
Bilirubin, Direct: 0.2 mg/dL (ref 0.00–0.40)
Total Protein: 7.4 g/dL (ref 6.0–8.5)

## 2017-12-02 LAB — CBC
Hematocrit: 48.1 % (ref 37.5–51.0)
Hemoglobin: 16.5 g/dL (ref 13.0–17.7)
MCH: 31.4 pg (ref 26.6–33.0)
MCHC: 34.3 g/dL (ref 31.5–35.7)
MCV: 91 fL (ref 79–97)
Platelets: 211 10*3/uL (ref 150–379)
RBC: 5.26 x10E6/uL (ref 4.14–5.80)
RDW: 13.8 % (ref 12.3–15.4)
WBC: 5.5 10*3/uL (ref 3.4–10.8)

## 2017-12-02 LAB — RENAL FUNCTION PANEL
Albumin: 4.8 g/dL (ref 3.5–5.5)
BUN/Creatinine Ratio: 17 (ref 9–20)
BUN: 15 mg/dL (ref 6–24)
CO2: 26 mmol/L (ref 20–29)
Calcium: 9.6 mg/dL (ref 8.7–10.2)
Chloride: 100 mmol/L (ref 96–106)
Creatinine, Ser: 0.88 mg/dL (ref 0.76–1.27)
GFR, EST AFRICAN AMERICAN: 109 mL/min/{1.73_m2} (ref >59)
GFR, EST NON AFRICAN AMERICAN: 95 mL/min/{1.73_m2} (ref >59)
GLUCOSE: 118 mg/dL — AB (ref 65–99)
POTASSIUM: 4.7 mmol/L (ref 3.5–5.2)
Phosphorus: 3.3 mg/dL (ref 2.5–4.5)
SODIUM: 140 mmol/L (ref 134–144)

## 2017-12-02 LAB — HEMOGLOBIN A1C
ESTIMATED AVERAGE GLUCOSE: 105 mg/dL
Hgb A1c MFr Bld: 5.3 % (ref 4.8–5.6)

## 2017-12-02 NOTE — Progress Notes (Signed)
Please make patient aware of results via letter. In the context of his overall presentation any abnormal values are of no clinical significance.  Philis Fendt PA-C, 12/02/2017 11:22 AM

## 2017-12-03 ENCOUNTER — Other Ambulatory Visit: Payer: Self-pay | Admitting: Physician Assistant

## 2017-12-03 ENCOUNTER — Encounter: Payer: Self-pay | Admitting: Radiology

## 2017-12-03 LAB — TRICHOMONAS VAGINALIS, PROBE AMP: TRICH VAG BY NAA: NEGATIVE

## 2017-12-03 LAB — GC/CHLAMYDIA PROBE AMP
Chlamydia trachomatis, NAA: POSITIVE — AB
Neisseria gonorrhoeae by PCR: NEGATIVE

## 2017-12-03 MED ORDER — AZITHROMYCIN 250 MG PO TABS: ORAL_TABLET | ORAL | 0 refills | Status: AC

## 2017-12-21 ENCOUNTER — Other Ambulatory Visit: Payer: Self-pay

## 2017-12-21 ENCOUNTER — Ambulatory Visit (INDEPENDENT_AMBULATORY_CARE_PROVIDER_SITE_OTHER): Payer: BLUE CROSS/BLUE SHIELD | Admitting: Physician Assistant

## 2017-12-21 ENCOUNTER — Encounter: Payer: Self-pay | Admitting: Physician Assistant

## 2017-12-21 VITALS — BP 122/64 | HR 89 | Temp 98.1°F | Resp 16 | Ht 69.75 in | Wt 167.2 lb

## 2017-12-21 DIAGNOSIS — A749 Chlamydial infection, unspecified: Secondary | ICD-10-CM

## 2017-12-21 NOTE — Patient Instructions (Addendum)
I will contact you if labs are positive, otherwise, I will send you a letter.    IF you received an x-ray today, you will receive an invoice from [facility]. Please contact [facility] at [phone] with questions or concerns regarding your invoice.   IF you received labwork today, you will receive an invoice from [facility]. Please contact [facility] at [phone] with questions or concerns regarding your invoice.   Our billing staff will not be able to assist you with questions regarding bills from these companies.  You will be contacted with the lab results as soon as they are available. The fastest way to get your results is to activate your My Chart account. Instructions are located on the last page of this paperwork. If you have not heard from [place] regarding the results in 2 weeks, please contact this office.

## 2017-12-21 NOTE — Progress Notes (Signed)
    12/21/2017 1:43 PM   DOB: 1960/09/13 / MRN: [id number]  SUBJECTIVE:  Stephen Peters is a 57 y.o. male presenting for recheck of STI labs.  Patient recently diagnosed by me with chlamydia on a routine check.  Over the last year and a half, after his divorce, he admittedly has been "sowing some wild oats."  He has notified his partners of this positive result.  He has No Known Allergies.   He  has a past medical history of Allergy and Cataract.    He  reports that he quit smoking about 5 years ago. His smoking use included cigarettes. He has a 30.00 pack-year smoking history. He has never used smokeless tobacco. He reports that he drinks alcohol. He reports that he does not use drugs. He  reports that he currently engages in sexual activity. He reports using the following method of birth control/protection: None. The patient  has a past surgical history that includes Eye surgery.  His family history is not on file.  Review of Systems  Constitutional: Negative for chills, diaphoresis and fever.  Gastrointestinal: Negative for nausea.  Genitourinary: Negative for dysuria, flank pain, frequency, hematuria and urgency.  Skin: Negative for rash.  Neurological: Negative for dizziness.    The problem list and medications were reviewed and updated by myself where necessary and exist elsewhere in the encounter.   OBJECTIVE:  BP 122/64 (BP Location: Left Arm)   Pulse 89   Temp 98.1 F (36.7 C) (Oral)   Resp 16   Ht 5' 9.75" (1.772 m)   Wt 167 lb 3.2 oz (75.8 kg)   SpO2 96%   BMI 24.16 kg/m   Physical Exam  Constitutional: He is oriented to person, place, and time. He appears well-developed. He does not appear ill.  Eyes: Pupils are equal, round, and reactive to light. Conjunctivae and EOM are normal.  Cardiovascular: Normal rate.  Pulmonary/Chest: Effort normal.  Abdominal: He exhibits no distension.  Musculoskeletal: Normal range of motion.  Neurological: He is alert and oriented to  person, place, and time. No cranial nerve deficit. Coordination normal.  Skin: Skin is warm and dry. He is not diaphoretic.  Psychiatric: He has a normal mood and affect.  Nursing note and vitals reviewed.   No results found for this or any previous visit (from the past 72 hour(s)).  No results found.  ASSESSMENT AND PLAN:  [name] was seen today for labwork.  Diagnoses and all orders for this visit:  Chlamydia: Status post azithromycin 1000 mg.  I expect a normal result.  He is asymptomatic at this time. -     GC/Chlamydia Probe Amp -     HIV antibody -     RPR -     Trichomonas vaginalis, RNA    The patient is advised to call or return to clinic if he does not see an improvement in symptoms, or to seek the care of the closest emergency department if he worsens with the above plan.   Philis Fendt, MHS, PA-C [facility] 12/21/2017 1:43 PM

## 2017-12-22 LAB — HIV ANTIBODY (ROUTINE TESTING W REFLEX): HIV SCREEN 4TH GENERATION: NONREACTIVE

## 2017-12-22 LAB — RPR: RPR: NONREACTIVE

## 2017-12-23 LAB — GC/CHLAMYDIA PROBE AMP
Chlamydia trachomatis, NAA: NEGATIVE
Neisseria gonorrhoeae by PCR: NEGATIVE

## 2017-12-23 LAB — TRICHOMONAS VAGINALIS, PROBE AMP: TRICH VAG BY NAA: NEGATIVE

## 2017-12-24 ENCOUNTER — Encounter: Payer: Self-pay | Admitting: *Deleted

## 2018-01-21 DIAGNOSIS — M545 Low back pain: Secondary | ICD-10-CM | POA: Diagnosis not present

## 2018-01-21 DIAGNOSIS — M9903 Segmental and somatic dysfunction of lumbar region: Secondary | ICD-10-CM | POA: Diagnosis not present

## 2018-01-21 DIAGNOSIS — M9902 Segmental and somatic dysfunction of thoracic region: Secondary | ICD-10-CM | POA: Diagnosis not present

## 2018-01-21 DIAGNOSIS — M9901 Segmental and somatic dysfunction of cervical region: Secondary | ICD-10-CM | POA: Diagnosis not present

## 2018-02-17 ENCOUNTER — Other Ambulatory Visit: Payer: Self-pay | Admitting: Physician Assistant

## 2018-02-17 DIAGNOSIS — F411 Generalized anxiety disorder: Secondary | ICD-10-CM

## 2018-02-17 NOTE — Telephone Encounter (Signed)
Xanax refill Last Refill:11/30/17  #90 Last OV: 11/30/17 PCP: [place]   [place]

## 2018-02-17 NOTE — Telephone Encounter (Signed)
Copied from [place] [id number]. Topic: Quick Communication - Rx Refill/Question >> Feb 17, 2018 10:52 AM Yvette Rack wrote: Medication:  ALPRAZolam Duanne Moron) 0.5 MG tablet     Preferred Pharmacy (with phone number or street name): [id number] [place], [place]. AT [address] [phone] (Phone) [phone] (Fax)   Agent: Please be advised that RX refills may take up to 3 business days. We ask that you follow-up with your pharmacy.

## 2018-02-18 MED ORDER — ALPRAZOLAM 0.5 MG PO TABS
0.5000 mg | ORAL_TABLET | Freq: Every evening | ORAL | 0 refills | Status: AC | PRN
Start: 2018-02-18 — End: ?

## 2018-03-10 ENCOUNTER — Other Ambulatory Visit: Payer: Self-pay

## 2018-03-10 ENCOUNTER — Encounter: Payer: Self-pay | Admitting: Physician Assistant

## 2018-03-10 ENCOUNTER — Ambulatory Visit (INDEPENDENT_AMBULATORY_CARE_PROVIDER_SITE_OTHER): Payer: BLUE CROSS/BLUE SHIELD | Admitting: Physician Assistant

## 2018-03-10 VITALS — BP 148/67 | HR 87 | Temp 98.5°F | Resp 16 | Ht 71.0 in | Wt 164.8 lb

## 2018-03-10 DIAGNOSIS — R030 Elevated blood-pressure reading, without diagnosis of hypertension: Secondary | ICD-10-CM | POA: Diagnosis not present

## 2018-03-10 LAB — POCT CBC
GRANULOCYTE PERCENT: 65 % (ref 37–80)
HCT, POC: 48.9 % (ref 43.5–53.7)
Hemoglobin: 15.5 g/dL (ref 14.1–18.1)
Lymph, poc: 1.6 (ref 0.6–3.4)
MCH: 29.9 pg (ref 27–31.2)
MCHC: 31.7 g/dL — AB (ref 31.8–35.4)
MCV: 94.4 fL (ref 80–97)
MID (CBC): 0.6 (ref 0–0.9)
MPV: 7.2 fL (ref 0–99.8)
PLATELET COUNT, POC: 246 10*3/uL (ref 142–424)
POC Granulocyte: 4 (ref 2–6.9)
POC LYMPH PERCENT: 25.9 %L (ref 10–50)
POC MID %: 9.1 %M (ref 0–12)
RBC: 5.19 M/uL (ref 4.69–6.13)
RDW, POC: 12.8 %
WBC: 6.1 10*3/uL (ref 4.6–10.2)

## 2018-03-10 LAB — POCT URINALYSIS DIP (MANUAL ENTRY)
BILIRUBIN UA: NEGATIVE mg/dL
Bilirubin, UA: NEGATIVE
Blood, UA: NEGATIVE
Glucose, UA: NEGATIVE mg/dL
Leukocytes, UA: NEGATIVE
Nitrite, UA: NEGATIVE
PROTEIN UA: NEGATIVE mg/dL
SPEC GRAV UA: 1.015 (ref 1.010–1.025)
Urobilinogen, UA: 0.2 E.U./dL
pH, UA: 7.5 (ref 5.0–8.0)

## 2018-03-10 NOTE — Progress Notes (Signed)
03/11/2018 8:30 AM   DOB: 1961-06-11 / MRN: [id number]  SUBJECTIVE:  Stephen Peters is a 57 y.o. male presenting for HA and "don't feel quite right." He has a 30 pack year history of smoking and minimal plaque build up in the carotids.  Takes ASA and statin daily and does not miss doses.  He is worried about having a stroke. His pressure has been elevated lately, mostly into the 140s and this also worries him.  Symptoms present for about 3 weeks.  The problem is waxing and waning. He has tried going for walks which seems to help him feel better overall. He has no history of HTN.   He has No Known Allergies.   He  has a past medical history of Allergy and Cataract.    He  reports that he quit smoking about 5 years ago. His smoking use included cigarettes. He has a 30.00 pack-year smoking history. He has never used smokeless tobacco. He reports that he drinks alcohol. He reports that he does not use drugs. He  reports that he currently engages in sexual activity. He reports using the following method of birth control/protection: None. The patient  has a past surgical history that includes Eye surgery.  His family history is not on file.  Review of Systems  Constitutional: Negative for chills, diaphoresis and fever.  Respiratory: Negative for cough, hemoptysis, sputum production, shortness of breath and wheezing.   Cardiovascular: Negative for chest pain, orthopnea and leg swelling.  Gastrointestinal: Negative for nausea.  Genitourinary: Negative for dysuria, flank pain, frequency, hematuria and urgency.  Skin: Negative for rash.  Neurological: Positive for headaches. Negative for dizziness, tingling, tremors, sensory change, speech change, focal weakness, seizures, loss of consciousness and weakness.    The problem list and medications were reviewed and updated by myself where necessary and exist elsewhere in the encounter.   OBJECTIVE:  BP (!) 148/67   Pulse 87   Temp 98.5 F (36.9 C)    Resp 16   Ht 5\' 11"  (1.803 m)   Wt 164 lb 12.8 oz (74.8 kg)   SpO2 95%   BMI 22.98 kg/m   Wt Readings from Last 3 Encounters:  03/10/18 164 lb 12.8 oz (74.8 kg)  12/21/17 167 lb 3.2 oz (75.8 kg)  11/30/17 166 lb (75.3 kg)   Temp Readings from Last 3 Encounters:  03/10/18 98.5 F (36.9 C)  12/21/17 98.1 F (36.7 C) (Oral)  11/30/17 98.1 F (36.7 C) (Oral)   BP Readings from Last 3 Encounters:  03/10/18 (!) 148/67  12/21/17 122/64  11/30/17 122/60   Pulse Readings from Last 3 Encounters:  03/10/18 87  12/21/17 89  11/30/17 97    Physical Exam  Constitutional: He is oriented to person, place, and time. He appears well-developed. He does not appear ill.  Eyes: Pupils are equal, round, and reactive to light. Conjunctivae and EOM are normal.  Cardiovascular: Normal rate, regular rhythm, S1 normal, S2 normal, normal heart sounds, intact distal pulses and normal pulses. Exam reveals no gallop and no friction rub.  No murmur heard. Pulmonary/Chest: Effort normal. No stridor. No respiratory distress. He has no wheezes. He has no rales.  Abdominal: He exhibits no distension.  Musculoskeletal: Normal range of motion. He exhibits no edema.  Neurological: He is alert and oriented to person, place, and time. He has normal strength and normal reflexes. He is not disoriented. He displays no atrophy, no tremor and normal reflexes. No cranial nerve deficit  or sensory deficit. He exhibits normal muscle tone. He displays a negative Romberg sign. He displays no seizure activity. Coordination and gait normal.  FTN, RAM, sterognosis, position sense, tandem gait intact to challenge.   Skin: Skin is warm and dry. He is not diaphoretic.  Psychiatric: He has a normal mood and affect. His behavior is normal.  Nursing note and vitals reviewed.  Results for orders placed or performed in visit on 03/10/18  Renal Function Panel  Result Value Ref Range   Glucose 83 65 - 99 mg/dL   BUN 12 6 - 24  mg/dL   Creatinine, Ser 0.81 0.76 - 1.27 mg/dL   GFR calc non Af Amer 98 >59 mL/min/1.73   GFR calc Af Amer 113 >59 mL/min/1.73   BUN/Creatinine Ratio 15 9 - 20   Sodium 139 134 - 144 mmol/L   Potassium 4.3 3.5 - 5.2 mmol/L   Chloride 98 96 - 106 mmol/L   CO2 26 20 - 29 mmol/L   Calcium 9.6 8.7 - 10.2 mg/dL   Phosphorus WILL FOLLOW    Albumin 4.8 3.5 - 5.5 g/dL  Sedimentation Rate  Result Value Ref Range   Sed Rate 2 0 - 30 mm/hr  TSH  Result Value Ref Range   TSH 1.510 0.450 - 4.500 uIU/mL  POCT CBC  Result Value Ref Range   WBC 6.1 4.6 - 10.2 K/uL   Lymph, poc 1.6 0.6 - 3.4   POC LYMPH PERCENT 25.9 10 - 50 %L   MID (cbc) 0.6 0 - 0.9   POC MID % 9.1 0 - 12 %M   POC Granulocyte 4.0 2 - 6.9   Granulocyte percent 65.0 37 - 80 %G   RBC 5.19 4.69 - 6.13 M/uL   Hemoglobin 15.5 14.1 - 18.1 g/dL   HCT, POC 48.9 43.5 - 53.7 %   MCV 94.4 80 - 97 fL   MCH, POC 29.9 27 - 31.2 pg   MCHC 31.7 (A) 31.8 - 35.4 g/dL   RDW, POC 12.8 %   Platelet Count, POC 246 142 - 424 K/uL   MPV 7.2 0 - 99.8 fL  POCT urinalysis dipstick  Result Value Ref Range   Color, UA yellow yellow   Clarity, UA clear clear   Glucose, UA negative negative mg/dL   Bilirubin, UA negative negative   Ketones, POC UA negative negative mg/dL   Spec Grav, UA 1.015 1.010 - 1.025   Blood, UA negative negative   pH, UA 7.5 5.0 - 8.0   Protein Ur, POC negative negative mg/dL   Urobilinogen, UA 0.2 0.2 or 1.0 E.U./dL   Nitrite, UA Negative Negative   Leukocytes, UA Negative Negative   EKG: NSR with non specific t-wave flattening in lead three.    ASSESSMENT AND PLAN:  Murat was seen today for hypertension.  Diagnoses and all orders for this visit:  Elevated blood pressure reading: Work up reassuring.  T wave flattening in chest lead 3 however he has had this before in V4 and V5 and this resolved.  He is not having chest pain and SOB.  This finding was shared with Dr. Brigitte Pulse and we agrees that this is most likely an  insignificant finding.  I've asked Deshane to take some time away for work and to get lots of exercise, as this seems to improve his symptoms, which are most likely psychosomatic in nature.  -     POCT CBC -     Renal Function Panel -  POCT urinalysis dipstick -     Sedimentation Rate -     EKG 12-Lead -     TSH    The patient is advised to call or return to clinic if he does not see an improvement in symptoms, or to seek the care of the closest emergency department if he worsens with the above plan.   Philis Fendt, MHS, PA-C [facility] 03/11/2018 8:30 AM

## 2018-03-10 NOTE — Patient Instructions (Addendum)
     IF you received an x-ray today, you will receive an invoice from [facility]. Please contact [facility] at [phone] with questions or concerns regarding your invoice.   IF you received labwork today, you will receive an invoice from [facility]. Please contact [facility] at [phone] with questions or concerns regarding your invoice.   Our billing staff will not be able to assist you with questions regarding bills from these companies.  You will be contacted with the lab results as soon as they are available. The fastest way to get your results is to activate your My Chart account. Instructions are located on the last page of this paperwork. If you have not heard from us regarding the results in 2 weeks, please contact this office.     

## 2018-03-11 ENCOUNTER — Encounter: Payer: Self-pay | Admitting: Radiology

## 2018-03-11 ENCOUNTER — Telehealth: Payer: Self-pay | Admitting: Physician Assistant

## 2018-03-11 LAB — RENAL FUNCTION PANEL
ALBUMIN: 4.8 g/dL (ref 3.5–5.5)
BUN/Creatinine Ratio: 15 (ref 9–20)
BUN: 12 mg/dL (ref 6–24)
CHLORIDE: 98 mmol/L (ref 96–106)
CO2: 26 mmol/L (ref 20–29)
Calcium: 9.6 mg/dL (ref 8.7–10.2)
Creatinine, Ser: 0.81 mg/dL (ref 0.76–1.27)
GFR calc Af Amer: 113 mL/min/{1.73_m2} (ref >59)
GFR calc non Af Amer: 98 mL/min/{1.73_m2} (ref >59)
GLUCOSE: 83 mg/dL (ref 65–99)
POTASSIUM: 4.3 mmol/L (ref 3.5–5.2)
Phosphorus: 3.7 mg/dL (ref 2.5–4.5)
Sodium: 139 mmol/L (ref 134–144)

## 2018-03-11 LAB — TSH: TSH: 1.51 u[IU]/mL (ref 0.450–4.500)

## 2018-03-11 LAB — SEDIMENTATION RATE: SED RATE: 2 mm/h (ref 0–30)

## 2018-04-01 ENCOUNTER — Ambulatory Visit (INDEPENDENT_AMBULATORY_CARE_PROVIDER_SITE_OTHER): Payer: BLUE CROSS/BLUE SHIELD | Admitting: Physician Assistant

## 2018-04-01 ENCOUNTER — Encounter: Payer: Self-pay | Admitting: Physician Assistant

## 2018-04-01 ENCOUNTER — Other Ambulatory Visit: Payer: Self-pay

## 2018-04-01 VITALS — BP 138/76 | HR 77 | Temp 98.2°F | Resp 16 | Ht 71.0 in | Wt 163.2 lb

## 2018-04-01 DIAGNOSIS — Z1159 Encounter for screening for other viral diseases: Secondary | ICD-10-CM | POA: Diagnosis not present

## 2018-04-01 DIAGNOSIS — R030 Elevated blood-pressure reading, without diagnosis of hypertension: Secondary | ICD-10-CM | POA: Diagnosis not present

## 2018-04-01 MED ORDER — LISINOPRIL 10 MG PO TABS: 10.0000 mg | ORAL_TABLET | Freq: Every day | ORAL | 3 refills | Status: AC

## 2018-04-01 NOTE — Patient Instructions (Signed)
° ° ° °  If you have lab work done today you will be contacted with your lab results within the next 2 weeks.  If you have not heard from us then please contact us. The fastest way to get your results is to register for My Chart. ° ° °IF you received an x-ray today, you will receive an invoice from [facility]. Please contact  Radiology at [phone] with questions or concerns regarding your invoice.  ° °IF you received labwork today, you will receive an invoice from [facility]. Please contact [facility] at [phone] with questions or concerns regarding your invoice.  ° °Our billing staff will not be able to assist you with questions regarding bills from these companies. ° °You will be contacted with the lab results as soon as they are available. The fastest way to get your results is to activate your My Chart account. Instructions are located on the last page of this paperwork. If you have not heard from us regarding the results in 2 weeks, please contact this office. °  ° ° ° °

## 2018-04-01 NOTE — Progress Notes (Signed)
04/01/2018 9:04 AM   DOB: [birth year] / MRN: [id number]  SUBJECTIVE:  Stephen Peters is a 57 y.o. male presenting for recheck BP. He continues to feel well today and denies any complaints.  His elevated pressures do however concern him and he wants to do everything he can within reason to avoid a stroke.  He has a 30 pack year history of smoking.   He has No Known Allergies.   He  has a past medical history of Allergy and Cataract.    He  reports that he quit smoking about 5 years ago. His smoking use included cigarettes. He has a 30.00 pack-year smoking history. He has never used smokeless tobacco. He reports that he drinks alcohol. He reports that he does not use drugs. He  reports that he currently engages in sexual activity. He reports using the following method of birth control/protection: None. The patient  has a past surgical history that includes Eye surgery.  His family history is not on file.  Review of Systems  Constitutional: Negative for chills, diaphoresis and fever.  Eyes: Negative.   Respiratory: Negative for cough, hemoptysis, sputum production, shortness of breath and wheezing.   Cardiovascular: Negative for chest pain, orthopnea and leg swelling.  Gastrointestinal: Negative for nausea.  Skin: Negative for rash.  Neurological: Negative for dizziness, sensory change, speech change, focal weakness and headaches.    The problem list and medications were reviewed and updated by myself where necessary and exist elsewhere in the encounter.   OBJECTIVE:  BP 138/76   Pulse 77   Temp 98.2 F (36.8 C)   Resp 16   Ht 5\' 11"  (1.803 m)   Wt 163 lb 3.2 oz (74 kg)   SpO2 98%   BMI 22.76 kg/m   Wt Readings from Last 3 Encounters:  04/01/18 163 lb 3.2 oz (74 kg)  03/10/18 164 lb 12.8 oz (74.8 kg)  12/21/17 167 lb 3.2 oz (75.8 kg)   Temp Readings from Last 3 Encounters:  04/01/18 98.2 F (36.8 C)  03/10/18 98.5 F (36.9 C)  12/21/17 98.1 F (36.7 C) (Oral)   BP  Readings from Last 3 Encounters:  04/01/18 138/76  03/10/18 (!) 148/67  12/21/17 122/64   Pulse Readings from Last 3 Encounters:  04/01/18 77  03/10/18 87  12/21/17 89    Physical Exam  Constitutional: He is oriented to person, place, and time. He appears well-developed. He is active.  Non-toxic appearance. He does not appear ill.  Eyes: Pupils are equal, round, and reactive to light. Conjunctivae and EOM are normal.  Cardiovascular: Normal rate, regular rhythm, S1 normal, S2 normal, normal heart sounds, intact distal pulses and normal pulses. Exam reveals no gallop and no friction rub.  No murmur heard. Pulmonary/Chest: Effort normal. No stridor. No respiratory distress. He has no wheezes. He has no rales.  Abdominal: He exhibits no distension.  Musculoskeletal: Normal range of motion. He exhibits no edema or tenderness.  Neurological: He is alert and oriented to person, place, and time. No cranial nerve deficit. Coordination normal.  Skin: Skin is warm and dry. He is not diaphoretic. No pallor.  Psychiatric: He has a normal mood and affect.  Nursing note and vitals reviewed.   Lab Results  Component Value Date   HGBA1C 5.3 11/30/2017    Lab Results  Component Value Date   WBC 6.1 03/10/2018   HGB 15.5 03/10/2018   HCT 48.9 03/10/2018   MCV 94.4 03/10/2018   PLT  211 11/30/2017    Lab Results  Component Value Date   CREATININE 0.81 03/10/2018   BUN 12 03/10/2018   NA 139 03/10/2018   K 4.3 03/10/2018   CL 98 03/10/2018   CO2 26 03/10/2018    Lab Results  Component Value Date   ALT 32 11/30/2017   AST 30 11/30/2017   ALKPHOS 66 11/30/2017   BILITOT 0.6 11/30/2017    Lab Results  Component Value Date   TSH 1.510 03/10/2018    Lab Results  Component Value Date   CHOL 172 11/30/2017   HDL 66 11/30/2017   LDLCALC 90 11/30/2017   TRIG 82 11/30/2017   CHOLHDL 2.6 11/30/2017     ASSESSMENT AND PLAN:  Haile was seen today for hypertension.  Diagnoses  and all orders for this visit:  Prehypertension: More for prevention.  Side effects discussed.  Will titrate as needed.  -     lisinopril (PRINIVIL,ZESTRIL) 10 MG tablet; Take 1 tablet (10 mg total) by mouth daily.  Need for hepatitis C screening test -     Hepatitis C antibody  Other orders -     Cancel: Tdap vaccine greater than or equal to 7yo IM    The patient is advised to call or return to clinic if he does not see an improvement in symptoms, or to seek the care of the closest emergency department if he worsens with the above plan.   Philis Fendt, MHS, PA-C [facility] 04/01/2018 9:04 AM

## 2018-04-02 ENCOUNTER — Encounter: Payer: Self-pay | Admitting: *Deleted

## 2018-04-02 LAB — HEPATITIS C ANTIBODY

## 2018-04-02 NOTE — Progress Notes (Signed)
Please send a letter with negative result. Philis Fendt, MS, PA-C 8:42 AM, 04/02/2018

## 2018-04-17 DIAGNOSIS — M9901 Segmental and somatic dysfunction of cervical region: Secondary | ICD-10-CM | POA: Diagnosis not present

## 2018-04-17 DIAGNOSIS — M9902 Segmental and somatic dysfunction of thoracic region: Secondary | ICD-10-CM | POA: Diagnosis not present

## 2018-04-17 DIAGNOSIS — M545 Low back pain: Secondary | ICD-10-CM | POA: Diagnosis not present

## 2018-04-17 DIAGNOSIS — M9903 Segmental and somatic dysfunction of lumbar region: Secondary | ICD-10-CM | POA: Diagnosis not present

## 2018-05-29 DIAGNOSIS — I10 Essential (primary) hypertension: Secondary | ICD-10-CM | POA: Diagnosis not present

## 2018-05-29 DIAGNOSIS — I6523 Occlusion and stenosis of bilateral carotid arteries: Secondary | ICD-10-CM | POA: Diagnosis not present

## 2018-05-29 DIAGNOSIS — R21 Rash and other nonspecific skin eruption: Secondary | ICD-10-CM | POA: Diagnosis not present

## 2018-05-29 DIAGNOSIS — K219 Gastro-esophageal reflux disease without esophagitis: Secondary | ICD-10-CM | POA: Diagnosis not present

## 2018-06-04 ENCOUNTER — Ambulatory Visit: Payer: BLUE CROSS/BLUE SHIELD | Admitting: Physician Assistant

## 2018-06-05 NOTE — Telephone Encounter (Signed)
done

## 2018-06-11 DIAGNOSIS — M9902 Segmental and somatic dysfunction of thoracic region: Secondary | ICD-10-CM | POA: Diagnosis not present

## 2018-06-11 DIAGNOSIS — M9903 Segmental and somatic dysfunction of lumbar region: Secondary | ICD-10-CM | POA: Diagnosis not present

## 2018-06-11 DIAGNOSIS — M545 Low back pain: Secondary | ICD-10-CM | POA: Diagnosis not present

## 2018-06-27 DIAGNOSIS — R21 Rash and other nonspecific skin eruption: Secondary | ICD-10-CM | POA: Diagnosis not present

## 2018-07-21 DIAGNOSIS — M9902 Segmental and somatic dysfunction of thoracic region: Secondary | ICD-10-CM | POA: Diagnosis not present

## 2018-07-21 DIAGNOSIS — M545 Low back pain: Secondary | ICD-10-CM | POA: Diagnosis not present

## 2018-07-21 DIAGNOSIS — M9903 Segmental and somatic dysfunction of lumbar region: Secondary | ICD-10-CM | POA: Diagnosis not present

## 2018-07-21 DIAGNOSIS — M9901 Segmental and somatic dysfunction of cervical region: Secondary | ICD-10-CM | POA: Diagnosis not present

## 2018-08-19 DIAGNOSIS — M5386 Other specified dorsopathies, lumbar region: Secondary | ICD-10-CM | POA: Diagnosis not present

## 2018-08-19 DIAGNOSIS — M9903 Segmental and somatic dysfunction of lumbar region: Secondary | ICD-10-CM | POA: Diagnosis not present

## 2018-08-19 DIAGNOSIS — M9901 Segmental and somatic dysfunction of cervical region: Secondary | ICD-10-CM | POA: Diagnosis not present

## 2018-08-19 DIAGNOSIS — M5431 Sciatica, right side: Secondary | ICD-10-CM | POA: Diagnosis not present

## 2018-08-20 IMAGING — DX DG NECK SOFT TISSUE
2 series · 2 of 2 positions shown · non-contrast
Comparison: None in PACs

CLINICAL DATA: Two months of throat pain.  Former smoker.

EXAM:
NECK SOFT TISSUES - 1+ VIEW

[neck lat]
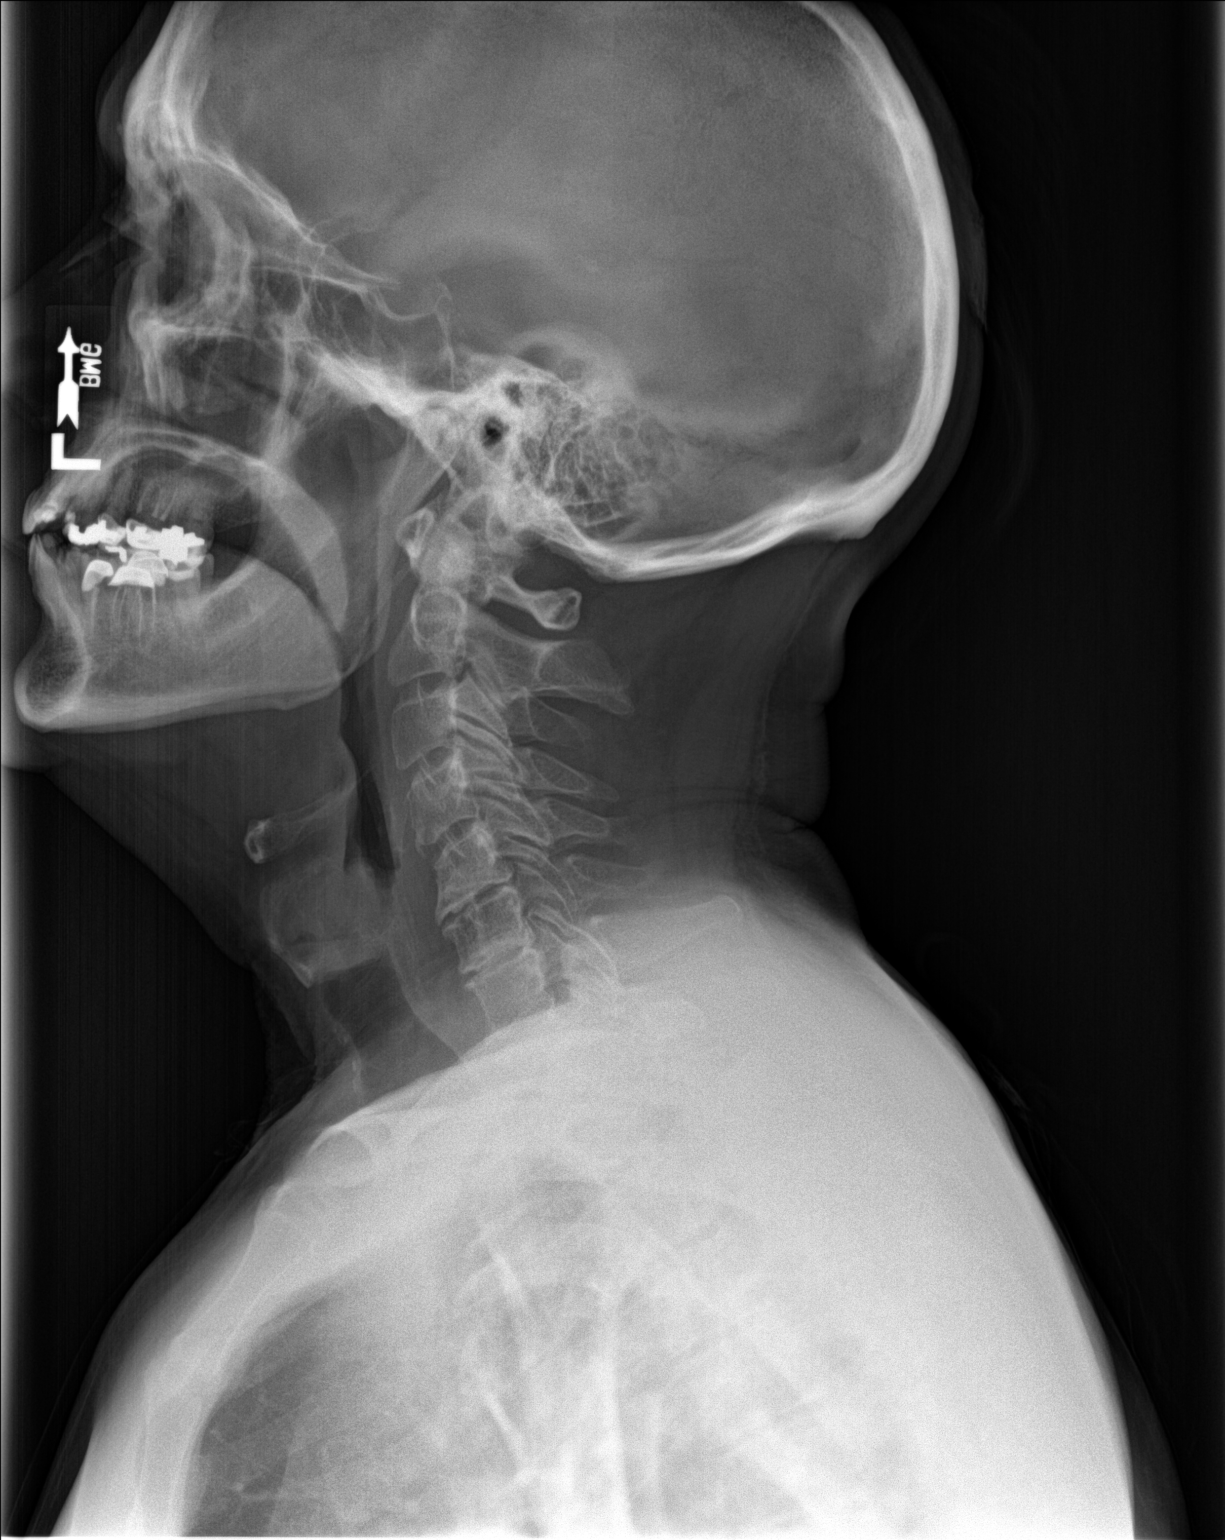

[neck ap]
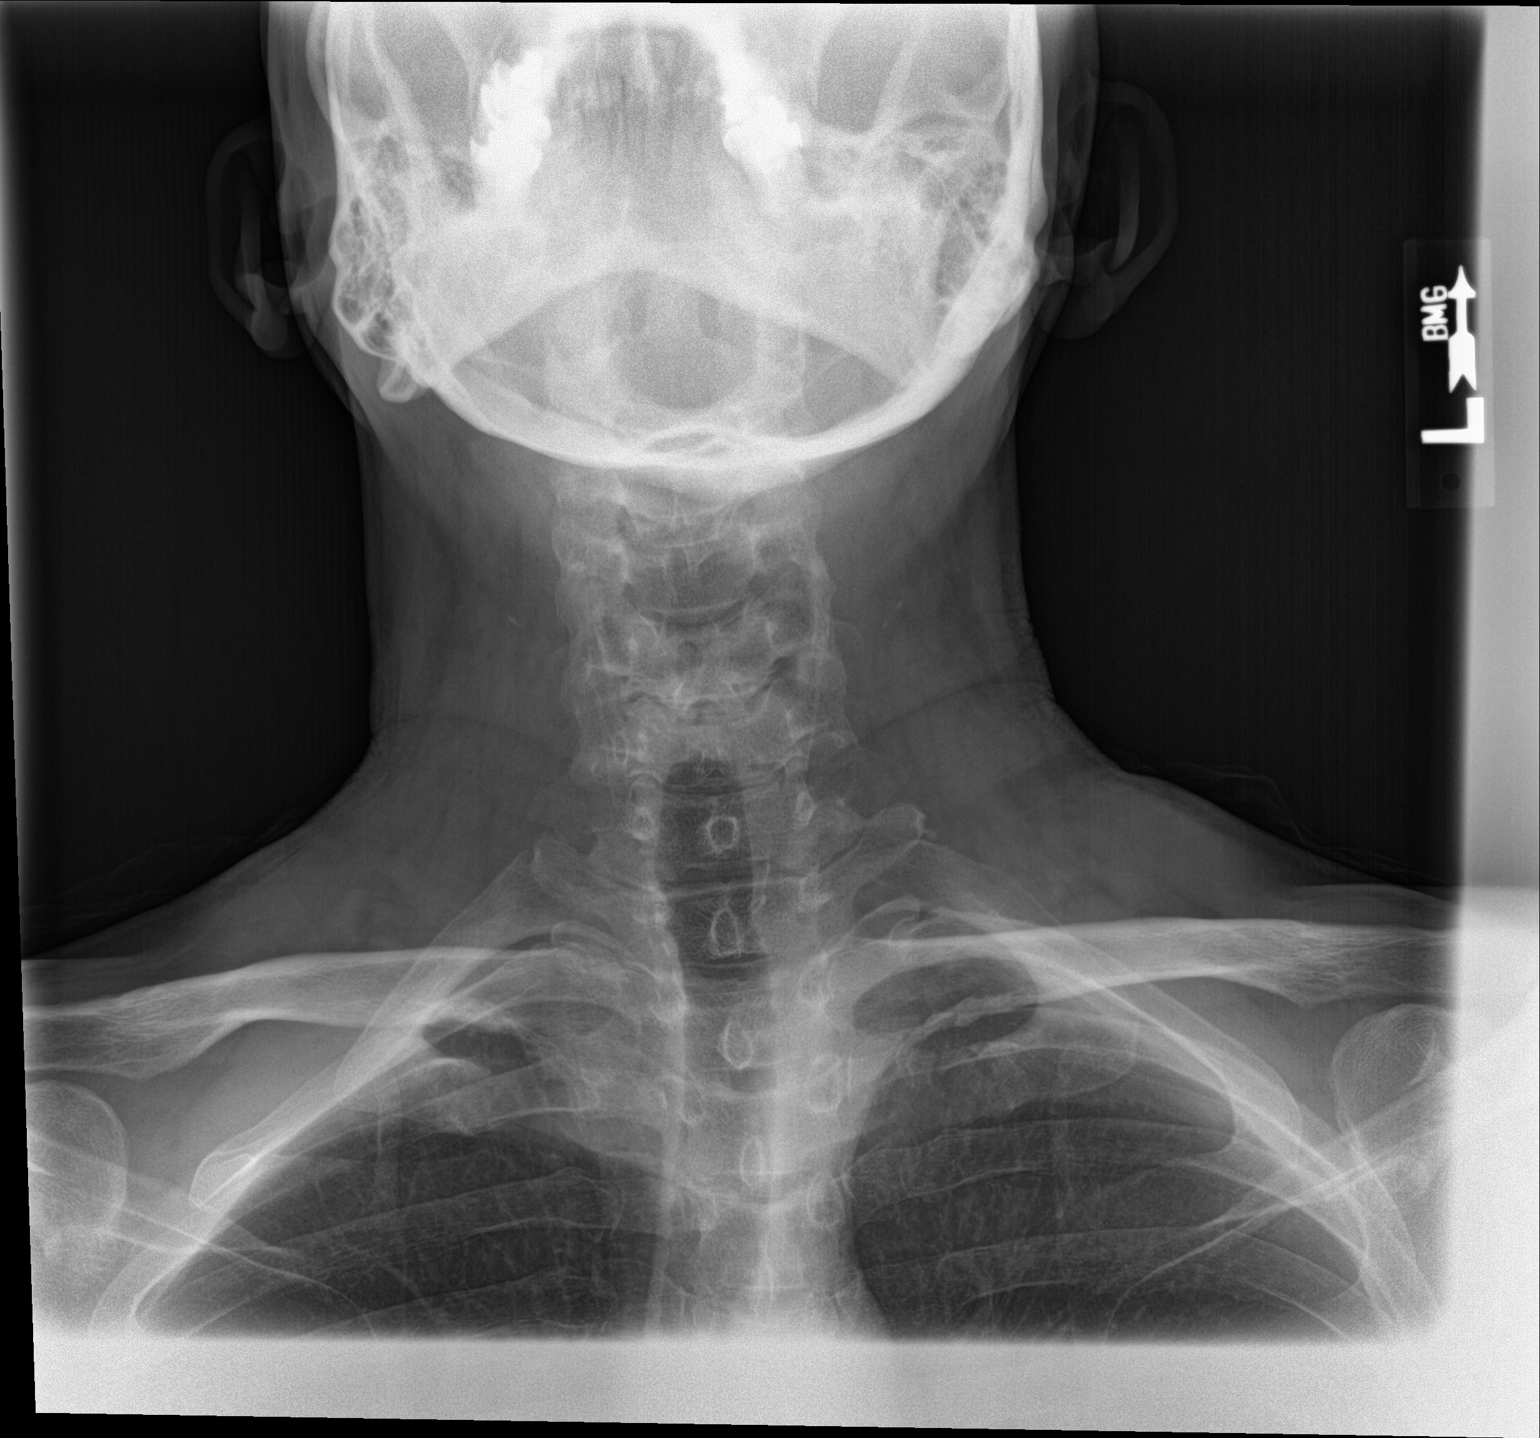

[2 of 2 positions shown; findings below may reference images not displayed]

FINDINGS: The prevertebral soft tissue spaces are normal in thickness. The
visualized portions of the nasopharygeal in oropharyngeal and
hypopharynx heel airway are normal. There are degenerative disc
changes in the mid and lower cervical spine. There are
calcifications in the carotid arteries bilaterally.
IMPRESSION: There is no acute abnormality of the cervical airway or prevertebral
soft tissues.

## 2018-09-05 DIAGNOSIS — J069 Acute upper respiratory infection, unspecified: Secondary | ICD-10-CM | POA: Diagnosis not present

## 2018-09-05 DIAGNOSIS — I10 Essential (primary) hypertension: Secondary | ICD-10-CM | POA: Diagnosis not present

## 2018-09-23 DIAGNOSIS — M9901 Segmental and somatic dysfunction of cervical region: Secondary | ICD-10-CM | POA: Diagnosis not present

## 2018-09-23 DIAGNOSIS — M9903 Segmental and somatic dysfunction of lumbar region: Secondary | ICD-10-CM | POA: Diagnosis not present

## 2018-09-23 DIAGNOSIS — M5431 Sciatica, right side: Secondary | ICD-10-CM | POA: Diagnosis not present

## 2018-09-23 DIAGNOSIS — M5386 Other specified dorsopathies, lumbar region: Secondary | ICD-10-CM | POA: Diagnosis not present

## 2018-11-10 DIAGNOSIS — M9901 Segmental and somatic dysfunction of cervical region: Secondary | ICD-10-CM | POA: Diagnosis not present

## 2018-11-10 DIAGNOSIS — M5431 Sciatica, right side: Secondary | ICD-10-CM | POA: Diagnosis not present

## 2018-11-10 DIAGNOSIS — M5386 Other specified dorsopathies, lumbar region: Secondary | ICD-10-CM | POA: Diagnosis not present

## 2018-11-10 DIAGNOSIS — M9903 Segmental and somatic dysfunction of lumbar region: Secondary | ICD-10-CM | POA: Diagnosis not present

## 2018-11-13 ENCOUNTER — Other Ambulatory Visit: Payer: Self-pay | Admitting: Physician Assistant

## 2018-11-13 DIAGNOSIS — K219 Gastro-esophageal reflux disease without esophagitis: Secondary | ICD-10-CM

## 2018-11-13 DIAGNOSIS — R142 Eructation: Secondary | ICD-10-CM

## 2019-01-12 DIAGNOSIS — M9903 Segmental and somatic dysfunction of lumbar region: Secondary | ICD-10-CM | POA: Diagnosis not present

## 2019-01-12 DIAGNOSIS — M5431 Sciatica, right side: Secondary | ICD-10-CM | POA: Diagnosis not present

## 2019-01-12 DIAGNOSIS — M9901 Segmental and somatic dysfunction of cervical region: Secondary | ICD-10-CM | POA: Diagnosis not present

## 2019-01-12 DIAGNOSIS — M5386 Other specified dorsopathies, lumbar region: Secondary | ICD-10-CM | POA: Diagnosis not present

## 2019-03-03 DIAGNOSIS — M9903 Segmental and somatic dysfunction of lumbar region: Secondary | ICD-10-CM | POA: Diagnosis not present

## 2019-03-03 DIAGNOSIS — M5431 Sciatica, right side: Secondary | ICD-10-CM | POA: Diagnosis not present

## 2019-03-03 DIAGNOSIS — M9901 Segmental and somatic dysfunction of cervical region: Secondary | ICD-10-CM | POA: Diagnosis not present

## 2019-03-03 DIAGNOSIS — M5386 Other specified dorsopathies, lumbar region: Secondary | ICD-10-CM | POA: Diagnosis not present

## 2019-04-08 ENCOUNTER — Other Ambulatory Visit: Payer: Self-pay | Admitting: Physician Assistant

## 2019-04-08 NOTE — Telephone Encounter (Signed)
Patient states that refill is not needed at that he doesn't see [facility] for care anymore.

## 2019-04-08 NOTE — Telephone Encounter (Signed)
Requested medication (s) are due for refill today: no  Requested medication (s) are on the active medication list: no  Last refill:    Future visit scheduled: no  Notes to clinic:   Patient states that refill is not needed at that he doesn't see primary [facility] for care anymore. Requested Prescriptions  Pending Prescriptions Disp Refills   olmesartan (BENICAR) 5 MG tablet [Pharmacy Med Name: OLMESARTAN MEDOXOMIL 5 MG TAB] 90 tablet 3    Sig: TAKE 1 TABLET BY MOUTH EVERY DAY     Cardiovascular:  Angiotensin Receptor Blockers Failed - 04/08/2019  6:28 AM      Failed - Cr in normal range and within 180 days    Creat  Date Value Ref Range Status  05/04/2014 0.94 0.50 - 1.35 mg/dL Final   Creatinine, Ser  Date Value Ref Range Status  03/10/2018 0.81 0.76 - 1.27 mg/dL Final         Failed - K in normal range and within 180 days    Potassium  Date Value Ref Range Status  03/10/2018 4.3 3.5 - 5.2 mmol/L Final         Failed - Valid encounter within last 6 months    Recent Outpatient Visits          1 year ago Prehypertension   [facility], PA-C   1 year ago Elevated blood pressure reading   [facility], PA-C   1 year ago Chlamydia   [facility], PA-C   1 year ago Routine screening for STI (sexually transmitted infection)   [facility], PA-C   1 year ago Visit for suture removal   [facility] [place], [place] - Patient is not pregnant      Passed - Last BP in normal range    BP Readings from Last 1 Encounters:  04/01/18 138/76

## 2019-04-13 DIAGNOSIS — M9903 Segmental and somatic dysfunction of lumbar region: Secondary | ICD-10-CM | POA: Diagnosis not present

## 2019-04-13 DIAGNOSIS — M9901 Segmental and somatic dysfunction of cervical region: Secondary | ICD-10-CM | POA: Diagnosis not present

## 2019-04-13 DIAGNOSIS — M5386 Other specified dorsopathies, lumbar region: Secondary | ICD-10-CM | POA: Diagnosis not present

## 2019-04-13 DIAGNOSIS — M5431 Sciatica, right side: Secondary | ICD-10-CM | POA: Diagnosis not present

## 2019-05-09 ENCOUNTER — Other Ambulatory Visit: Payer: Self-pay | Admitting: Physician Assistant

## 2019-05-24 ENCOUNTER — Other Ambulatory Visit: Payer: Self-pay | Admitting: Physician Assistant

## 2019-06-02 DIAGNOSIS — M9901 Segmental and somatic dysfunction of cervical region: Secondary | ICD-10-CM | POA: Diagnosis not present

## 2019-06-02 DIAGNOSIS — M5386 Other specified dorsopathies, lumbar region: Secondary | ICD-10-CM | POA: Diagnosis not present

## 2019-06-02 DIAGNOSIS — M5431 Sciatica, right side: Secondary | ICD-10-CM | POA: Diagnosis not present

## 2019-06-02 DIAGNOSIS — M9903 Segmental and somatic dysfunction of lumbar region: Secondary | ICD-10-CM | POA: Diagnosis not present

## 2019-06-23 DIAGNOSIS — M5431 Sciatica, right side: Secondary | ICD-10-CM | POA: Diagnosis not present

## 2019-06-23 DIAGNOSIS — M9901 Segmental and somatic dysfunction of cervical region: Secondary | ICD-10-CM | POA: Diagnosis not present

## 2019-06-23 DIAGNOSIS — M9903 Segmental and somatic dysfunction of lumbar region: Secondary | ICD-10-CM | POA: Diagnosis not present

## 2019-06-23 DIAGNOSIS — M5386 Other specified dorsopathies, lumbar region: Secondary | ICD-10-CM | POA: Diagnosis not present

## 2019-07-22 DIAGNOSIS — M5386 Other specified dorsopathies, lumbar region: Secondary | ICD-10-CM | POA: Diagnosis not present

## 2019-07-22 DIAGNOSIS — M9901 Segmental and somatic dysfunction of cervical region: Secondary | ICD-10-CM | POA: Diagnosis not present

## 2019-07-22 DIAGNOSIS — M9903 Segmental and somatic dysfunction of lumbar region: Secondary | ICD-10-CM | POA: Diagnosis not present

## 2019-07-22 DIAGNOSIS — M5431 Sciatica, right side: Secondary | ICD-10-CM | POA: Diagnosis not present

## 2019-07-29 DIAGNOSIS — M9903 Segmental and somatic dysfunction of lumbar region: Secondary | ICD-10-CM | POA: Diagnosis not present

## 2019-07-29 DIAGNOSIS — M9901 Segmental and somatic dysfunction of cervical region: Secondary | ICD-10-CM | POA: Diagnosis not present

## 2019-07-29 DIAGNOSIS — M5431 Sciatica, right side: Secondary | ICD-10-CM | POA: Diagnosis not present

## 2019-07-29 DIAGNOSIS — M5386 Other specified dorsopathies, lumbar region: Secondary | ICD-10-CM | POA: Diagnosis not present

## 2019-08-24 DIAGNOSIS — M9903 Segmental and somatic dysfunction of lumbar region: Secondary | ICD-10-CM | POA: Diagnosis not present

## 2019-08-24 DIAGNOSIS — M9901 Segmental and somatic dysfunction of cervical region: Secondary | ICD-10-CM | POA: Diagnosis not present

## 2019-08-24 DIAGNOSIS — M5431 Sciatica, right side: Secondary | ICD-10-CM | POA: Diagnosis not present

## 2019-08-24 DIAGNOSIS — M5386 Other specified dorsopathies, lumbar region: Secondary | ICD-10-CM | POA: Diagnosis not present

## 2019-09-01 DIAGNOSIS — M5386 Other specified dorsopathies, lumbar region: Secondary | ICD-10-CM | POA: Diagnosis not present

## 2019-09-01 DIAGNOSIS — M5431 Sciatica, right side: Secondary | ICD-10-CM | POA: Diagnosis not present

## 2019-09-01 DIAGNOSIS — M9903 Segmental and somatic dysfunction of lumbar region: Secondary | ICD-10-CM | POA: Diagnosis not present

## 2019-09-01 DIAGNOSIS — M9901 Segmental and somatic dysfunction of cervical region: Secondary | ICD-10-CM | POA: Diagnosis not present

## 2019-09-09 DIAGNOSIS — M9903 Segmental and somatic dysfunction of lumbar region: Secondary | ICD-10-CM | POA: Diagnosis not present

## 2019-09-09 DIAGNOSIS — M5386 Other specified dorsopathies, lumbar region: Secondary | ICD-10-CM | POA: Diagnosis not present

## 2019-09-09 DIAGNOSIS — M5431 Sciatica, right side: Secondary | ICD-10-CM | POA: Diagnosis not present

## 2019-09-09 DIAGNOSIS — M9901 Segmental and somatic dysfunction of cervical region: Secondary | ICD-10-CM | POA: Diagnosis not present

## 2019-09-23 DIAGNOSIS — M5386 Other specified dorsopathies, lumbar region: Secondary | ICD-10-CM | POA: Diagnosis not present

## 2019-09-23 DIAGNOSIS — M5431 Sciatica, right side: Secondary | ICD-10-CM | POA: Diagnosis not present

## 2019-09-23 DIAGNOSIS — M9901 Segmental and somatic dysfunction of cervical region: Secondary | ICD-10-CM | POA: Diagnosis not present

## 2019-09-23 DIAGNOSIS — M9903 Segmental and somatic dysfunction of lumbar region: Secondary | ICD-10-CM | POA: Diagnosis not present

## 2019-10-14 DIAGNOSIS — Z0000 Encounter for general adult medical examination without abnormal findings: Secondary | ICD-10-CM | POA: Diagnosis not present

## 2019-10-14 DIAGNOSIS — E7800 Pure hypercholesterolemia, unspecified: Secondary | ICD-10-CM | POA: Diagnosis not present

## 2019-10-14 DIAGNOSIS — Z125 Encounter for screening for malignant neoplasm of prostate: Secondary | ICD-10-CM | POA: Diagnosis not present

## 2019-10-14 DIAGNOSIS — Z23 Encounter for immunization: Secondary | ICD-10-CM | POA: Diagnosis not present

## 2019-11-04 DIAGNOSIS — M5386 Other specified dorsopathies, lumbar region: Secondary | ICD-10-CM | POA: Diagnosis not present

## 2019-11-04 DIAGNOSIS — M5431 Sciatica, right side: Secondary | ICD-10-CM | POA: Diagnosis not present

## 2019-11-04 DIAGNOSIS — M9901 Segmental and somatic dysfunction of cervical region: Secondary | ICD-10-CM | POA: Diagnosis not present

## 2019-11-04 DIAGNOSIS — M9903 Segmental and somatic dysfunction of lumbar region: Secondary | ICD-10-CM | POA: Diagnosis not present

## 2020-05-30 DIAGNOSIS — M9901 Segmental and somatic dysfunction of cervical region: Secondary | ICD-10-CM | POA: Diagnosis not present

## 2020-05-30 DIAGNOSIS — M5386 Other specified dorsopathies, lumbar region: Secondary | ICD-10-CM | POA: Diagnosis not present

## 2020-05-30 DIAGNOSIS — M9903 Segmental and somatic dysfunction of lumbar region: Secondary | ICD-10-CM | POA: Diagnosis not present

## 2020-05-30 DIAGNOSIS — M5431 Sciatica, right side: Secondary | ICD-10-CM | POA: Diagnosis not present

## 2020-05-31 DIAGNOSIS — R350 Frequency of micturition: Secondary | ICD-10-CM | POA: Diagnosis not present

## 2020-05-31 DIAGNOSIS — Z1211 Encounter for screening for malignant neoplasm of colon: Secondary | ICD-10-CM | POA: Diagnosis not present

## 2020-07-19 DIAGNOSIS — M9903 Segmental and somatic dysfunction of lumbar region: Secondary | ICD-10-CM | POA: Diagnosis not present

## 2020-07-19 DIAGNOSIS — M5386 Other specified dorsopathies, lumbar region: Secondary | ICD-10-CM | POA: Diagnosis not present

## 2020-07-19 DIAGNOSIS — M9901 Segmental and somatic dysfunction of cervical region: Secondary | ICD-10-CM | POA: Diagnosis not present

## 2020-07-19 DIAGNOSIS — M5431 Sciatica, right side: Secondary | ICD-10-CM | POA: Diagnosis not present

## 2020-07-27 DIAGNOSIS — M5386 Other specified dorsopathies, lumbar region: Secondary | ICD-10-CM | POA: Diagnosis not present

## 2020-07-27 DIAGNOSIS — M9901 Segmental and somatic dysfunction of cervical region: Secondary | ICD-10-CM | POA: Diagnosis not present

## 2020-07-27 DIAGNOSIS — M9903 Segmental and somatic dysfunction of lumbar region: Secondary | ICD-10-CM | POA: Diagnosis not present

## 2020-07-27 DIAGNOSIS — M5431 Sciatica, right side: Secondary | ICD-10-CM | POA: Diagnosis not present
